# Patient Record
Sex: Male | Born: 1945 | Hispanic: Refuse to answer
Health system: Midwestern US, Community
[De-identification: ages and names within clinical notes are randomized; demographics above are authoritative.]

## PROBLEM LIST (undated history)

## (undated) DIAGNOSIS — I341 Nonrheumatic mitral (valve) prolapse: Secondary | ICD-10-CM

## (undated) DIAGNOSIS — K4090 Unilateral inguinal hernia, without obstruction or gangrene, not specified as recurrent: Secondary | ICD-10-CM

## (undated) DIAGNOSIS — E785 Hyperlipidemia, unspecified: Secondary | ICD-10-CM

## (undated) DIAGNOSIS — C61 Malignant neoplasm of prostate: Secondary | ICD-10-CM

## (undated) DIAGNOSIS — K219 Gastro-esophageal reflux disease without esophagitis: Secondary | ICD-10-CM

## (undated) DIAGNOSIS — C801 Malignant (primary) neoplasm, unspecified: Secondary | ICD-10-CM

## (undated) HISTORY — DX: Hyperlipidemia, unspecified: E78.5

## (undated) HISTORY — DX: Unilateral inguinal hernia, without obstruction or gangrene, not specified as recurrent: K40.90

## (undated) HISTORY — PX: PROSTATE BIOPSY: SHX241

## (undated) HISTORY — PX: INGUINAL HERNIA REPAIR: SUR1180

## (undated) HISTORY — DX: Gastro-esophageal reflux disease without esophagitis: K21.9

## (undated) HISTORY — PX: COLONOSCOPY: SHX174

## (undated) HISTORY — DX: Malignant (primary) neoplasm, unspecified: C80.1

---

## 1966-08-07 HISTORY — PX: TONSILLECTOMY: SUR1361

## 1997-08-07 HISTORY — PX: HAND SURGERY: SHX662

## 1997-11-05 ENCOUNTER — Encounter (HOSPITAL_COMMUNITY): Admission: RE | Admit: 1997-11-05 | Discharge: 1998-02-03 | Payer: Self-pay | Admitting: Psychiatry

## 1998-12-02 ENCOUNTER — Ambulatory Visit (HOSPITAL_BASED_OUTPATIENT_CLINIC_OR_DEPARTMENT_OTHER): Admission: RE | Admit: 1998-12-02 | Discharge: 1998-12-02 | Payer: Self-pay | Admitting: Orthopedic Surgery

## 2000-07-02 ENCOUNTER — Other Ambulatory Visit: Admission: RE | Admit: 2000-07-02 | Discharge: 2000-07-02 | Payer: Self-pay | Admitting: Gastroenterology

## 2000-07-02 ENCOUNTER — Encounter (INDEPENDENT_AMBULATORY_CARE_PROVIDER_SITE_OTHER): Payer: Self-pay

## 2004-09-05 ENCOUNTER — Ambulatory Visit: Payer: Self-pay | Admitting: Gastroenterology

## 2004-09-19 ENCOUNTER — Ambulatory Visit: Payer: Self-pay | Admitting: Gastroenterology

## 2007-03-05 ENCOUNTER — Ambulatory Visit: Payer: Self-pay

## 2007-03-05 ENCOUNTER — Encounter (INDEPENDENT_AMBULATORY_CARE_PROVIDER_SITE_OTHER): Payer: Self-pay | Admitting: Internal Medicine

## 2008-06-15 ENCOUNTER — Ambulatory Visit: Payer: Self-pay | Admitting: Gastroenterology

## 2008-06-22 ENCOUNTER — Encounter: Payer: Self-pay | Admitting: Gastroenterology

## 2008-06-22 ENCOUNTER — Ambulatory Visit: Payer: Self-pay | Admitting: Gastroenterology

## 2008-06-24 ENCOUNTER — Encounter: Payer: Self-pay | Admitting: Gastroenterology

## 2011-03-03 ENCOUNTER — Other Ambulatory Visit: Payer: Self-pay | Admitting: Dermatology

## 2011-09-20 ENCOUNTER — Encounter: Payer: Self-pay | Admitting: Gastroenterology

## 2012-05-01 ENCOUNTER — Encounter: Payer: Self-pay | Admitting: Gastroenterology

## 2012-06-24 ENCOUNTER — Encounter: Payer: Self-pay | Admitting: Gastroenterology

## 2012-07-17 ENCOUNTER — Encounter: Payer: Self-pay | Admitting: Gastroenterology

## 2012-07-17 ENCOUNTER — Ambulatory Visit (AMBULATORY_SURGERY_CENTER): Payer: BLUE CROSS/BLUE SHIELD | Admitting: *Deleted

## 2012-07-17 VITALS — Ht 67.0 in | Wt 135.2 lb

## 2012-07-17 DIAGNOSIS — Z1211 Encounter for screening for malignant neoplasm of colon: Secondary | ICD-10-CM

## 2012-07-17 DIAGNOSIS — Z86010 Personal history of colonic polyps: Secondary | ICD-10-CM

## 2012-07-17 MED ORDER — MOVIPREP 100 G PO SOLR: ORAL | Status: DC

## 2012-08-09 ENCOUNTER — Telehealth: Payer: Self-pay | Admitting: Gastroenterology

## 2012-08-09 ENCOUNTER — Encounter: Payer: Self-pay | Admitting: Gastroenterology

## 2012-08-09 ENCOUNTER — Ambulatory Visit (AMBULATORY_SURGERY_CENTER): Payer: Medicare Other | Admitting: Gastroenterology

## 2012-08-09 VITALS — BP 133/85 | HR 66 | Temp 97.5°F | Resp 14 | Ht 67.0 in | Wt 135.0 lb

## 2012-08-09 DIAGNOSIS — K635 Polyp of colon: Secondary | ICD-10-CM

## 2012-08-09 DIAGNOSIS — Z86010 Personal history of colonic polyps: Secondary | ICD-10-CM

## 2012-08-09 DIAGNOSIS — Z1211 Encounter for screening for malignant neoplasm of colon: Secondary | ICD-10-CM

## 2012-08-09 DIAGNOSIS — K5730 Diverticulosis of large intestine without perforation or abscess without bleeding: Secondary | ICD-10-CM

## 2012-08-09 DIAGNOSIS — D126 Benign neoplasm of colon, unspecified: Secondary | ICD-10-CM

## 2012-08-09 MED ORDER — SODIUM CHLORIDE 0.9 % IV SOLN: 500.0000 mL | INTRAVENOUS | Status: DC

## 2012-08-09 NOTE — Telephone Encounter (Signed)
Wife answered phone, states Tyler Waller is resting.  States he is having "crampy" abd  Pain.  Wanted to know if he could take tylenol.  Advised that tylenol is ok, but if pain becomes 4 on a scale of 0-10 To call office.  Also advised wife to have him up walking between rest periods, and to try lying of left side to alleviate discomfort.  Wife verbalized understanding.

## 2012-08-09 NOTE — Op Note (Signed)
[facility] [address] [place], [address]   COLONOSCOPY PROCEDURE REPORT  PATIENT: Tyler Waller, Tyler Waller  MR#: [id number] BIRTHDATE: 06-10-1946 , 66  yrs. old GENDER: Male ENDOSCOPIST: Mardella Layman, MD, [facility] REFERRED BY: PROCEDURE DATE:  08/09/2012 PROCEDURE:   Colonoscopy with snare polypectomy ASA CLASS:   Class II INDICATIONS: MEDICATIONS: Propofol (Diprivan) 240 mg IV  DESCRIPTION OF PROCEDURE:   After the risks and benefits and of the procedure were explained, informed consent was obtained.  A digital rectal exam revealed no abnormalities of the rectum.    The Fuse-Demo-Scope  endoscope was introduced through the anus and advanced to the cecum, which was identified by both the appendix and ileocecal valve .  The quality of the prep was adequate. .  The instrument was then slowly withdrawn as the colon was fully examined.     COLON FINDINGS: Images taken but only available in hard copy form that will be scanned into EPIC Agricultural engineer).   Mild diverticulosis was noted in the descending colon and sigmoid colon.   A smooth flat polyp ranging between 3-57mm in size was found at the hepatic flexure.  A polypectomy was performed with a cold snare.  The resection was complete and the polyp tissue was completely retrieved.   The colon was otherwise normal.  There was no diverticulosis, inflammation, polyps or cancers unless previously stated.     Retroflexed views revealed no abnormalities.     The scope was then withdrawn from the patient and the procedure completed.  COMPLICATIONS: There were no complications. ENDOSCOPIC IMPRESSION: 1.   Images taken but only available in hard copy form that will be scanned into EPIC [employer]). 2.   Mild diverticulosis was noted in the descending colon and sigmoid colon 3.   Flat polyp ranging between 3-52mm in size was found at the hepatic flexure; polypectomy was performed with a cold snare 4.   The colon was  otherwise normal  RECOMMENDATIONS: 1.  Repeat colonoscopy in 5 years if polyp adenomatous; otherwise 10 years 2.  High fiber diet   REPEAT EXAM:  cc:W.  Buren Kos, MD  _______________________________ eSigned:  Mardella Layman, MD, [facility] 08/09/2012 8:54 AM

## 2012-08-09 NOTE — Progress Notes (Signed)
Patient did not experience any of the following events: a burn prior to discharge; a fall within the facility; wrong site/side/patient/procedure/implant event; or a hospital transfer or hospital admission upon discharge from the facility. (G8907) Patient did not have preoperative order for IV antibiotic SSI prophylaxis. (G8918)  

## 2012-08-09 NOTE — Patient Instructions (Addendum)
YOU HAD AN ENDOSCOPIC PROCEDURE TODAY AT THE [facility]: Refer to the procedure report that was given to you for any specific questions about what was found during the examination.  If the procedure report does not answer your questions, please call your gastroenterologist to clarify.  If you requested that your care partner not be given the details of your procedure findings, then the procedure report has been included in a sealed envelope for you to review at your convenience later.  YOU SHOULD EXPECT: Some feelings of bloating in the abdomen. Passage of more gas than usual.  Walking can help get rid of the air that was put into your GI tract during the procedure and reduce the bloating. If you had a lower endoscopy (such as a colonoscopy or flexible sigmoidoscopy) you may notice spotting of blood in your stool or on the toilet paper. If you underwent a bowel prep for your procedure, then you may not have a normal bowel movement for a few days.  DIET: Your first meal following the procedure should be a light meal and then it is ok to progress to your normal diet.  A half-sandwich or bowl of soup is an example of a good first meal.  Heavy or fried foods are harder to digest and may make you feel nauseous or bloated.  Likewise meals heavy in dairy and vegetables can cause extra gas to form and this can also increase the bloating.  Drink plenty of fluids but you should avoid alcoholic beverages for 24 hours.  ACTIVITY: Your care partner should take you home directly after the procedure.  You should plan to take it easy, moving slowly for the rest of the day.  You can resume normal activity the day after the procedure however you should NOT DRIVE or use heavy machinery for 24 hours (because of the sedation medicines used during the test).    SYMPTOMS TO REPORT IMMEDIATELY: A gastroenterologist can be reached at any hour.  During normal business hours, 8:30 AM to 5:00 PM Monday through Friday,  call [phone].  After hours and on weekends, please call the GI answering service at [phone] who will take a message and have the physician on call contact you.   Following lower endoscopy (colonoscopy or flexible sigmoidoscopy):  Excessive amounts of blood in the stool  Significant tenderness or worsening of abdominal pains  Swelling of the abdomen that is new, acute  Fever of 100F or higher   FOLLOW UP: If any biopsies were taken you will be contacted by phone or by letter within the next 1-3 weeks.  Call your gastroenterologist if you have not heard about the biopsies in 3 weeks.  Our staff will call the home number listed on your records the next business day following your procedure to check on you and address any questions or concerns that you may have at that time regarding the information given to you following your procedure. This is a courtesy call and so if there is no answer at the home number and we have not heard from you through the emergency physician on call, we will assume that you have returned to your regular daily activities without incident.  SIGNATURES/CONFIDENTIALITY: You and/or your care partner have signed paperwork which will be entered into your electronic medical record.  These signatures attest to the fact that that the information above on your After Visit Summary has been reviewed and is understood.  Full responsibility of the confidentiality of   this discharge information lies with you and/or your care-partner.   Resume medications.Information given on polyps,diverticulosis with discharge instructions. 

## 2012-08-12 ENCOUNTER — Encounter: Payer: Self-pay | Admitting: Gastroenterology

## 2012-08-12 ENCOUNTER — Telehealth: Payer: Self-pay | Admitting: *Deleted

## 2012-08-12 NOTE — Telephone Encounter (Signed)
Left message that we called for f/u 

## 2012-08-13 ENCOUNTER — Encounter: Payer: Self-pay | Admitting: Gastroenterology

## 2014-04-16 ENCOUNTER — Encounter: Payer: Self-pay | Admitting: Gastroenterology

## 2016-10-31 DIAGNOSIS — L240 Irritant contact dermatitis due to detergents: Secondary | ICD-10-CM | POA: Diagnosis not present

## 2016-10-31 DIAGNOSIS — L570 Actinic keratosis: Secondary | ICD-10-CM | POA: Diagnosis not present

## 2016-10-31 DIAGNOSIS — C44619 Basal cell carcinoma of skin of left upper limb, including shoulder: Secondary | ICD-10-CM | POA: Diagnosis not present

## 2016-10-31 DIAGNOSIS — D2272 Melanocytic nevi of left lower limb, including hip: Secondary | ICD-10-CM | POA: Diagnosis not present

## 2016-10-31 DIAGNOSIS — D225 Melanocytic nevi of trunk: Secondary | ICD-10-CM | POA: Diagnosis not present

## 2016-10-31 DIAGNOSIS — D1801 Hemangioma of skin and subcutaneous tissue: Secondary | ICD-10-CM | POA: Diagnosis not present

## 2016-10-31 DIAGNOSIS — D485 Neoplasm of uncertain behavior of skin: Secondary | ICD-10-CM | POA: Diagnosis not present

## 2016-10-31 DIAGNOSIS — Z85828 Personal history of other malignant neoplasm of skin: Secondary | ICD-10-CM | POA: Diagnosis not present

## 2016-10-31 DIAGNOSIS — L821 Other seborrheic keratosis: Secondary | ICD-10-CM | POA: Diagnosis not present

## 2016-11-16 DIAGNOSIS — C44619 Basal cell carcinoma of skin of left upper limb, including shoulder: Secondary | ICD-10-CM | POA: Diagnosis not present

## 2016-11-16 DIAGNOSIS — Z85828 Personal history of other malignant neoplasm of skin: Secondary | ICD-10-CM | POA: Diagnosis not present

## 2017-06-05 DIAGNOSIS — H43811 Vitreous degeneration, right eye: Secondary | ICD-10-CM | POA: Diagnosis not present

## 2017-06-05 DIAGNOSIS — H04123 Dry eye syndrome of bilateral lacrimal glands: Secondary | ICD-10-CM | POA: Diagnosis not present

## 2017-06-05 DIAGNOSIS — H2513 Age-related nuclear cataract, bilateral: Secondary | ICD-10-CM | POA: Diagnosis not present

## 2017-07-10 DIAGNOSIS — E7849 Other hyperlipidemia: Secondary | ICD-10-CM | POA: Diagnosis not present

## 2017-07-10 DIAGNOSIS — R7301 Impaired fasting glucose: Secondary | ICD-10-CM | POA: Diagnosis not present

## 2017-07-10 DIAGNOSIS — Z125 Encounter for screening for malignant neoplasm of prostate: Secondary | ICD-10-CM | POA: Diagnosis not present

## 2017-07-17 DIAGNOSIS — R7301 Impaired fasting glucose: Secondary | ICD-10-CM | POA: Diagnosis not present

## 2017-07-17 DIAGNOSIS — Z86010 Personal history of colonic polyps: Secondary | ICD-10-CM | POA: Diagnosis not present

## 2017-07-17 DIAGNOSIS — Z0000 Encounter for general adult medical examination without abnormal findings: Secondary | ICD-10-CM | POA: Diagnosis not present

## 2017-07-17 DIAGNOSIS — R972 Elevated prostate specific antigen [PSA]: Secondary | ICD-10-CM | POA: Diagnosis not present

## 2017-07-17 DIAGNOSIS — E7849 Other hyperlipidemia: Secondary | ICD-10-CM | POA: Diagnosis not present

## 2017-07-17 DIAGNOSIS — R748 Abnormal levels of other serum enzymes: Secondary | ICD-10-CM | POA: Diagnosis not present

## 2017-07-17 DIAGNOSIS — Z1389 Encounter for screening for other disorder: Secondary | ICD-10-CM | POA: Diagnosis not present

## 2017-07-17 DIAGNOSIS — I10 Essential (primary) hypertension: Secondary | ICD-10-CM | POA: Diagnosis not present

## 2017-07-17 DIAGNOSIS — Z6820 Body mass index (BMI) 20.0-20.9, adult: Secondary | ICD-10-CM | POA: Diagnosis not present

## 2017-07-17 DIAGNOSIS — Z23 Encounter for immunization: Secondary | ICD-10-CM | POA: Diagnosis not present

## 2017-07-17 DIAGNOSIS — L84 Corns and callosities: Secondary | ICD-10-CM | POA: Diagnosis not present

## 2017-07-17 DIAGNOSIS — K4090 Unilateral inguinal hernia, without obstruction or gangrene, not specified as recurrent: Secondary | ICD-10-CM | POA: Diagnosis not present

## 2017-08-29 ENCOUNTER — Encounter: Payer: Self-pay | Admitting: *Deleted

## 2017-09-01 DIAGNOSIS — E785 Hyperlipidemia, unspecified: Secondary | ICD-10-CM | POA: Diagnosis not present

## 2017-09-01 DIAGNOSIS — R531 Weakness: Secondary | ICD-10-CM | POA: Diagnosis not present

## 2017-09-01 DIAGNOSIS — I6350 Cerebral infarction due to unspecified occlusion or stenosis of unspecified cerebral artery: Secondary | ICD-10-CM | POA: Diagnosis not present

## 2017-09-01 DIAGNOSIS — J8410 Pulmonary fibrosis, unspecified: Secondary | ICD-10-CM | POA: Diagnosis not present

## 2017-09-01 DIAGNOSIS — R55 Syncope and collapse: Secondary | ICD-10-CM | POA: Diagnosis not present

## 2017-09-01 DIAGNOSIS — Z87891 Personal history of nicotine dependence: Secondary | ICD-10-CM | POA: Diagnosis not present

## 2017-09-01 DIAGNOSIS — R4182 Altered mental status, unspecified: Secondary | ICD-10-CM | POA: Diagnosis not present

## 2017-09-01 DIAGNOSIS — G9389 Other specified disorders of brain: Secondary | ICD-10-CM | POA: Diagnosis not present

## 2017-09-01 DIAGNOSIS — R4702 Dysphasia: Secondary | ICD-10-CM | POA: Diagnosis not present

## 2017-09-01 DIAGNOSIS — R471 Dysarthria and anarthria: Secondary | ICD-10-CM | POA: Diagnosis not present

## 2017-09-01 DIAGNOSIS — R270 Ataxia, unspecified: Secondary | ICD-10-CM | POA: Diagnosis not present

## 2017-09-01 DIAGNOSIS — Z85828 Personal history of other malignant neoplasm of skin: Secondary | ICD-10-CM | POA: Diagnosis not present

## 2017-09-01 DIAGNOSIS — E7800 Pure hypercholesterolemia, unspecified: Secondary | ICD-10-CM | POA: Diagnosis not present

## 2017-09-01 DIAGNOSIS — R29818 Other symptoms and signs involving the nervous system: Secondary | ICD-10-CM | POA: Diagnosis not present

## 2017-09-01 DIAGNOSIS — R4781 Slurred speech: Secondary | ICD-10-CM | POA: Diagnosis not present

## 2017-09-01 DIAGNOSIS — I639 Cerebral infarction, unspecified: Secondary | ICD-10-CM | POA: Diagnosis not present

## 2017-09-01 DIAGNOSIS — I340 Nonrheumatic mitral (valve) insufficiency: Secondary | ICD-10-CM | POA: Diagnosis not present

## 2017-09-01 DIAGNOSIS — I959 Hypotension, unspecified: Secondary | ICD-10-CM | POA: Diagnosis not present

## 2017-09-01 NOTE — Nursing Note (Signed)
Adult Patient History Form-Text       Adult Patient History Entered On:  09/01/2017 22:20 EST    Performed On:  09/01/2017 22:11 EST by Clydie Braun, RN, MARY A               General Info   Patient Identified :   Identification band, Verbal   Patient Identified :   Steven May   Information Given By :   Self, Spouse   Preferred Mode of Communication :   Verbal, Written   Accompanied By :   Spouse, Daughter   In Charge of News (ICON) Name :   [name] (wife) [phone]   Pregnancy Status :   N/A   In Charge of News (ICON) Code :   [phone]   Has the patient received chemotherapy or biotherapy within the last 48 hours? :   No   Is the patient currently (2-3 days) receiving radiation treatment? :   No   ECKARD, RN, MARY A - 09/01/2017 22:11 EST   Allergies   (As Of: 09/01/2017 22:20:00 EST)   Allergies (Active)   No Known Medication Allergies  Estimated Onset Date:   Unspecified ; Created By:   Julienne Kass RN, Pilar Grammes; Reaction Status:   Active ; Category:   Drug ; Substance:   No Known Medication Allergies ; Type:   Allergy ; Updated By:   Gordy Clement; Reviewed Date:   09/01/2017 22:13 EST        Medication History   Medication List   (As Of: 09/01/2017 22:20:00 EST)   Normal Order    aspirin 325 mg DR Tab  :   aspirin 325 mg DR Tab ; Status:   Ordered ; Ordered As Mnemonic:   aspirin ; Simple Display Line:   325 mg, 1 tabs, Oral, Daily ; Ordering Provider:   Ala Bent; Catalog Code:   aspirin ; Order Dt/Tm:   09/01/2017 20:55:01 ; Comment:   DO NOT CRUSH          enoxaparin 40 mg/0.4 mL Inj Soln 0.4 mL  :   enoxaparin 40 mg/0.4 mL Inj Soln 0.4 mL ; Status:   Ordered ; Ordered As Mnemonic:   Lovenox ; Simple Display Line:   40 mg, 0.4 mL, Subcutaneous, Daily ; Ordering Provider:   Ala Bent; Catalog Code:   enoxaparin ; Order Dt/Tm:   09/01/2017 20:54:47 ; Comment:   HIGH ALERT MEDICATION - USE CAUTION........Marland KitchenCBC every other day.Marland KitchenMarland KitchenMarland KitchenNotify MD if platelet <150,000 or drops > 50% from  Baseline          labetalol 5 mg/mL IV Soln 4 mL  :   labetalol 5 mg/mL IV Soln 4 mL ; Status:   Ordered ; Ordered As Mnemonic:   labetalol IV push ; Simple Display Line:   10 mg, 2 mL, IV Push, q95min, PRN: other (see comment) ; Ordering Provider:   Ala Bent; Catalog Code:   labetalol ; Order Dt/Tm:   09/01/2017 20:55:01 ; Comment:   For Systolic greater than 220 or DBP greater than 120 (SBP and DBP maximums are acute Ischemic Stroke recommendations) In Ischemic Stroke, do NOT lower SBP less than 200 or DBP less than 100 with medications unless specified by MD. Use tPA Order Set for BP management if tPA administered          aspirin 81 mg Chew Tab  :   aspirin 81 mg Chew Tab ; Status:  Completed ; Ordered As Mnemonic:   aspirin ; Simple Display Line:   324 mg, 4 tabs, Chewed, Once ; Ordering Provider:   Debera Lat; Catalog Code:   aspirin ; Order Dt/Tm:   09/01/2017 19:41:57          iopamidol 76% Inj Soln 100 mL  :   iopamidol 76% Inj Soln 100 mL ; Status:   Completed ; Ordered As Mnemonic:   Isovue-370 ; Simple Display Line:   100 mL, IV Contrast, On Call ; Ordering Provider:   Debera Lat; Catalog Code:   iopamidol ; Order Dt/Tm:   09/01/2017 19:15:43            Home Meds    rosuvastatin  :   rosuvastatin ; Status:   Documented ; Ordered As Mnemonic:   rosuvastatin 5 mg oral tablet ; Simple Display Line:   5 mg, 1 tabs, Oral, Daily, 0 Refill(s) ; Catalog Code:   rosuvastatin ; Order Dt/Tm:   09/01/2017 18:47:51            Problem History   (As Of: 09/01/2017 22:20:00 EST)   Problems(Active)    Hypercholesterolemia (SNOMED CT  :16109604 )  Name of Problem:   Hypercholesterolemia ; Recorder:   Clydie Braun, RN, MARY A; Confirmation:   Confirmed ; Classification:   Patient Stated ; Code:   [id number] ; Contributor System:   Dietitian ; Last Updated:   09/01/2017 22:13 EST ; Life Cycle Date:   09/01/2017 ; Life Cycle Status:   Active ; Vocabulary:   SNOMED CT        Skin cancer (SNOMED  CT  :1478295621 )  Name of Problem:   Skin cancer ; Onset Date:   2017 ; Recorder:   ECKARD, RN, MARY A; Confirmation:   Confirmed ; Classification:   Patient Stated ; Code:   [id number] ; Contributor System:   PowerChart ; Last Updated:   09/01/2017 22:14 EST ; Life Cycle Date:   09/01/2017 ; Life Cycle Status:   Active ; Vocabulary:   SNOMED CT   ; Comments:        09/01/2017 22:14 - Clydie Braun, RN, MARY A  removed        Diagnoses(Active)    Altered mental state  Date:   09/01/2017 ; Diagnosis Type:   Discharge ; Confirmation:   Confirmed ; Clinical Dx:   Altered mental state ; Classification:   Medical ; Clinical Service:   Non-Specified ; Code:   ICD-10-CM ; Probability:   0 ; Diagnosis Code:   R41.82      Potential stroke  Date:   09/01/2017 ; Diagnosis Type:   Reason For Visit ; Confirmation:   Confirmed ; Clinical Dx:   Potential stroke ; Classification:   Medical ; Clinical Service:   Emergency medicine ; Code:   PNED ; Probability:   0 ; Diagnosis Code:   [id number]        Procedure History        -    Procedure History   (As Of: 09/01/2017 22:20:00 EST)     Immunizations   Influenza Vaccine Status :   Received prior to admission, during current flu season   [place], RN, [place] A - 09/01/2017 22:11 EST   ID Risk Screen   Chills :   No   Cough (Any Duration) :   No   Fever :   No   Hemoptysis (Blood in Sputum) :  No   Night Sweats :   No   ECKARD, RN, MARY A - 09/01/2017 22:11 EST   3 or more loose/watery stools :   No   MRSA/VRE Screening :   None of these apply   Patient Recent Travel History :   No recent travel   Family Member Travel History :   No recent travel   [place], RN, [place] A - 09/01/2017 22:11 EST   Bloodless Medicine   Will Patient Accept Blood Transfusion and/or Blood Products :   Yes   ECKARD, RN, MARY A - 09/01/2017 22:11 EST   Nutrition   Nutritional Risk Factors :   None   ECKARD, RN, MARY A - 09/01/2017 22:11 EST   Functional   Sensory Deficits :   None   ADLs Prior to Admission  :   Independent   ECKARD, RN, MARY A - 09/01/2017 22:11 EST   Social History   Social History   (As Of: 09/01/2017 22:20:00 EST)   Tobacco:        Tobacco use: Former smoker, quit more than 30 days ago.   (Last Updated: 09/01/2017 22:17:10 EST by Clydie BraunECKARD, RN, MARY A)          Alcohol:        Denies   (Last Updated: 09/01/2017 22:17:15 EST by Clydie BraunECKARD, RN, MARY A)          Substance Abuse:        Denies   (Last Updated: 09/01/2017 22:17:21 EST by Clydie BraunECKARD, RN, MARY A)            Spiritual   Faith/Denomination :   Ephriam KnucklesChristian   Do you have a concern that you would like to address with a Chaplain? :   No   ECKARD, RN, MARY A - 09/01/2017 22:11 EST   Harm Screen   Injuries/Abuse/Neglect in Household :   Denies   Feels Unsafe at Home :   No   Suicidal Behavior :   None   Self Harming Behavior :   None   Suicidal Ideation :   None   ECKARD, RN, MARY A - 09/01/2017 22:11 EST   Advance Directive   Advance Directive :   Yes   Type of Advance Directive :   Living will, Medical durable power of attorney   Clydie BraunCKARD, RN, Corrie DandyMARY A - 09/01/2017 22:11 EST   Education   Written Language :   Lenox PondsEnglish   Primary Language :   Jacinto ReapEnglish   ECKARD, RN, MARY A - 09/01/2017 22:11 EST   Caregiver/Advocate Language   Patient :   Demonstration, Verbal explanation   Family :   Demonstration, Verbal explanation   ECKARD, RN, MARY A - 09/01/2017 22:11 EST   Barriers to Learning :   None evident   Teaching Method :   Demonstration, Explanation, Printed materials   Responsible Learner Present for Session :   Yes   Additional Session Learner(s) Present :   Spouse   ECKARD, RN, MARY A - 09/01/2017 22:11 EST   DCP GENERIC CODE   Unit/Room Orientation :   Verbalizes understanding   Environmental Safety :   Verbalizes understanding   Hand Washing :   Verbalizes understanding   Infection Prevention :   Verbalizes understanding   DVT Prophylaxis :   Verbalizes understanding   Isolation Precaution :   Verbalizes understanding   ECKARD, RN, MARY A - 09/01/2017 22:11 EST   DC Needs    Living  Situation :   Home independently   Anticipated Discharge Needs :   None   ECKARD, RN, MARY A - 09/01/2017 22:11 EST   Valuables and Belongings   Does Patient Have Valuables and Belongings :   Yes   ECKARD, RN, MARY A - 09/01/2017 22:11 EST   DCP GENERIC CODE   At Bedside :   Clothes, Jewelry, Purse/Wallet, wedding ring   ECKARD, RN, MARY A - 09/01/2017 22:11 EST   Admission Complete   Admission Complete :   Yes   ECKARD, RN, MARY A - 09/01/2017 22:11 EST

## 2017-09-02 DIAGNOSIS — R4781 Slurred speech: Secondary | ICD-10-CM | POA: Diagnosis not present

## 2017-09-02 DIAGNOSIS — R410 Disorientation, unspecified: Secondary | ICD-10-CM | POA: Diagnosis not present

## 2017-09-02 DIAGNOSIS — E7800 Pure hypercholesterolemia, unspecified: Secondary | ICD-10-CM | POA: Diagnosis not present

## 2017-09-02 DIAGNOSIS — Z7982 Long term (current) use of aspirin: Secondary | ICD-10-CM | POA: Diagnosis not present

## 2017-09-02 DIAGNOSIS — I959 Hypotension, unspecified: Secondary | ICD-10-CM | POA: Diagnosis not present

## 2017-09-02 DIAGNOSIS — Z87891 Personal history of nicotine dependence: Secondary | ICD-10-CM | POA: Diagnosis not present

## 2017-09-02 DIAGNOSIS — R55 Syncope and collapse: Secondary | ICD-10-CM | POA: Diagnosis not present

## 2017-09-02 DIAGNOSIS — I6350 Cerebral infarction due to unspecified occlusion or stenosis of unspecified cerebral artery: Secondary | ICD-10-CM | POA: Diagnosis not present

## 2017-09-02 DIAGNOSIS — E785 Hyperlipidemia, unspecified: Secondary | ICD-10-CM | POA: Diagnosis not present

## 2017-09-02 NOTE — Discharge Summary (Signed)
Inpatient Clinical Summary             [facility]  Post-Acute Care Transfer Instructions  PERSON INFORMATION   Name: Steven May, Steven   MRN: [id number]    FIN#: [id number]   PHYSICIANS  Admitting Physician: Ala BentBOND-MD,  BROOKE ERIKA  Attending Physician: Ala BentBOND-MD,  BROOKE ERIKA   PCP: PCP,  NONE  Discharge Diagnosis: Altered mental state; Hyperlipidemia; Near syncope  Comment:       PATIENT EDUCATION INFORMATION  Instructions:               Medication Leaflets:               Follow-up:                           With: Address: When:   Call [facility] office at [phone] if you have any questions regarding your hospitalization.         With: Address: When:   LARS RUNQUIST-MD [address] [place], [place] [id number]  [phone] Business (1) In 1 day 09/03/2017   Comments:   Call [facility] office on 09/03/17 to obtain a 30-day event monitor prior [place]       With: Address: When:   Primary Care Physician Dr. Durwin NoraWilliam Doughlas Shaw of [facility] [phone]([address] [place], [place])  Within 1 to 2 weeks   Comments:   Call for followup appointment from hospitali discharge.                             MEDICATION LIST  Medication Reconciliation at Discharge:          Medications That Have Not Changed  Other Medications  multivitamin with minerals (Centrum Silver oral tablet) 1 Tabs Oral (given by mouth) Monday/Wednesday/Friday.  Last Dose:____________________  naphazoline-pheniramine ophthalmic (Opcon-A 0.027%-0.315% ophthalmic solution) 1 Drops Ophthalmic (the eye) every day as needed allergy symptoms.  Last Dose:____________________  naproxen (Aleve 220 mg oral tablet) 1 Tabs Oral (given by mouth) every 12 hours as needed as needed for pain.  Last Dose:____________________  rosuvastatin (rosuvastatin 5 mg oral tablet) 1 Tabs Oral (given by mouth) every day.  Last Dose:____________________         Patient???s Final Home Medication List Upon Discharge:           multivitamin with  minerals (Centrum Silver oral tablet) 1 Tabs Oral (given by mouth) Monday/Wednesday/Friday.  naphazoline-pheniramine ophthalmic (Opcon-A 0.027%-0.315% ophthalmic solution) 1 Drops Ophthalmic (the eye) every day as needed allergy symptoms.  naproxen (Aleve 220 mg oral tablet) 1 Tabs Oral (given by mouth) every 12 hours as needed as needed for pain.  rosuvastatin (rosuvastatin 5 mg oral tablet) 1 Tabs Oral (given by mouth) every day.         Comment:       ORDERS          Order Name Order Details   Discharge Patient 09/02/17 16:52:00 EST, Discharge Home/Self Care

## 2017-09-02 NOTE — Case Communication (Signed)
CM Discharge Planning Assessment - Text       CM Discharge Planning Assessment Entered On:  09/02/2017 16:58 EST    Performed On:  09/02/2017 16:56 EST by Otelia Santee R               Home Environment   Living Environment :   Living Situation: Home independently  Current Home Treatments:   Home Devices/Equipment   Professional Skilled Services:   Therapist, sports and Community Resources:   Sensory Deficits:   Performed by: Margo Aye, PT, COURTNEY B-09/02/17 11:05:00       Affect/Behavior :   Appropriate, Calm, Cooperative   Lives With :   Spouse   Lives In :   Multilevel home   Living Situation :   Home independently   Key Contact Information :   PCP - Dr. Sandra Cockayne in [place], [place]   Outside Facility Information :   Pharmcy - [facility] in [place], [place]   Barriers at Home :   None   Nelda Marseille - 09/02/2017 16:56 EST   Home Environment   Home Equipment Rehab :   None   ADLs Prior to Admission :   Independent   Sensory Deficits :   None   SPRINGER,  COURTNEY R - 09/02/2017 16:56 EST   Discharge Needs I   CM Progress Note :   09/02/17  CRS:  CM met with pt to complete initial assessment.  His wife, Steven May, was present at bedside.  Pt is being discharged today.  They are in town from [facility] visiting their daughter.  Pt did not identify any needs or concerns at this time.     Otelia Santee R - 09/02/2017 17:01 EST   Previously Documented Discharge Needs :   DISCHARGE PLAN/NEEDS:  EQUIPMENT/TREATMENT NEEDS:       Previously Documented Benefits Information :   No discharge data available.     Anticipated Discharge Date :   09/02/2017 EST   Anticipated Discharge Time Slot :   1600-1800   Discharge To :   Home independently, Home with family support   Home Caregiver Name/Relationship CM :   Dondi Aime (wife): [phone]   Nelda Marseille - 09/02/2017 16:56 EST   Discharge Needs II   DischargDischarge Device/Equipment CMe Device/Equipment CM :   None   Professional Skilled Services :    No Needs   Needs Assistance with Transportation :   No   Discharge Transportation Arranged :   N/A   Needs Assistance at Home Upon Discharge :   No   Requires Caregiver Involvement :   No   Discharge Planning Time Spent :   20 minutes   Otelia Santee R - 09/02/2017 16:56 EST   Benefits   Admission Medicare Message Provided :   09/01/2017 20:03 EST   Nelda Marseille - 09/02/2017 16:56 EST   Insurance Information :   Managed Medicare   SPRINGER,  COURTNEY R - 09/02/2017 17:01 EST   Advance Directive   *Advance Directive :   No   (Comment: None on file Otelia Santee R - 09/02/2017 17:01 EST] )   Otelia Santee R - 09/02/2017 17:01 EST   Discharge Planning   Discharge Arrangements :       Patient Post-Acute Information    Patient Name: Steven May, Steven May  MRN: [id number]  FIN: [id number]  Gender: Male  DOB: August 29, 2045  Age:  72  Years        [facility] Post-Acute Placement(s):    Business Name: Business Address: Phone Number:      Discharged to home              *** No Post-Acute Service(s) Listed ***       Interventions Performed :   All resolved   Discharge Plan Discussion :   Discussed with patient, Discussed with family/caregiver, Patient agrees with plan, Family agrees with plan   Barriers to Discharge Identified :   None identified   Nelda MarseilleSPRINGER,  COURTNEY R - 09/02/2017 16:56 EST   Discharge Planning Assessment Status   Discharge Planning Assessment Complete :   Yes   SPRINGER,  COURTNEY R - 09/02/2017 16:56 EST

## 2017-09-02 NOTE — Nursing Note (Signed)
Nursing Discharge Summary - Text       Physician Discharge Summary Entered On:  09/02/2017 16:50 EST    Performed On:  09/02/2017 16:50 EST by Janeice RobinsonONWAY-DO,  SARAH T               DC Information   Provider Instructions for Diet :   A Healthy Diet   Provider Instructions for Activity :   Continue your regular activity   Janeice RobinsonCONWAY-DO,  SARAH T - 09/02/2017 16:50 EST

## 2017-09-02 NOTE — Nursing Note (Signed)
Medication Administration Follow Up-Text       Medication Administration Follow Up Entered On:  09/02/2017 9:48 EST    Performed On:  09/02/2017 9:48 EST by Cephus ShellingKane-Eames, RN, Elizabeth      Intervention Information:     acetaminophen  Performed by Cephus ShellingKane-Eames, RN, Elizabeth on 09/02/2017 08:21:00 EST       acetaminophen,650mg   Oral,mild pain (1-3)       Med Response   ED Medication Response :   Symptoms improved, Continue to observe for symptoms   Numeric Rating Pain Scale :   1   Pasero Opioid Induced Sedation Scale :   1 = Awake and alert   Respiratory Rate :   16 br/min   Cephus ShellingKane-Eames, RLanora Manis, Elizabeth - 09/02/2017 9:48 EST

## 2017-09-02 NOTE — Progress Notes (Signed)
SLP Time Spent With Patient - Text       SLP Time Spent With Patient Entered On:  09/02/2017 10:20 EST    Performed On:  09/02/2017 10:16 EST by Lissa MerlinFontenot,  Skyler J               Time Spent With Patient   SLP Time In :   10:10 EST   SLP Time Out :   10:12 EST   SLP Speech Althia FortsLang Eval Time :   0 minutes   SLP Total Individual Therapy Time :   0 minutes   SLP Treatment Time Comment :   Spoke w/ nurse, Marisue IvanLiz, who repots pt's speech is slow and possible cog deficits. Nursing reports pt tolerating regular diet without difficulty. Tolerating meds whole without difficulty. ST to follow-up Monday for speech/language eval.    SLP Total Tx Time :   0 minutes   Sherrie MustacheFontenot,  Skyler J - 09/02/2017 10:16 EST   OT Units Cancelled Missed     SLP Units Lost #1          Amount :    0                 Sherrie MustacheFontenot,  Skyler J - 09/02/2017 10:16 EST

## 2017-09-02 NOTE — Progress Notes (Signed)
Inpatient PT Examination - Text       Inpatient PT Examination Entered On:  09/02/2017 12:48 EST    Performed On:  09/02/2017 11:05 EST by HALL, PT, COURTNEY B               Reason for Treatment   Subjective Statement :   Pt transported to ER via ambulance. He picked up grandkids with wife and she noticed his speech was not right. He was able to get up steps after tripping and fell on floor when he reached the top. Pt doesn't recall the events.      *Reason for Referral :   adm for TIA vs CVA (mainly slurred speech)  negative MRI     HALL, PT, COURTNEY B - 09/02/2017 12:38 EST   General Info   Physical Therapy Orders :   PT Evaluation and Treatment Acute - 09/01/17 20:54:00 EST, Stop date 09/01/17 20:54:00 EST     Precautions RTF :    Hospitalist Team Color, 09/02/17 7:20:00 EST, Blue, Constant Indicator, Ordered   Notify Provider, 09/01/17 20:54:00 EST, Notify physician for any increased deficits., 09/01/17 20:54:00 EST, 09/01/17 20:54:00 EST, Ordered   Notify Provider, 09/01/17 20:54:00 EST, Failed nursing swallow  screen, 09/01/17 20:54:00 EST, 09/01/17 20:54:00 EST, Ordered   Notify Provider Laboratory Results, 09/01/17 20:54:00 EST, Blood glucose less than 70 or greater than 200 mg/dL (x 2 consecutive measurements), 09/01/17 20:54:00 EST, Ordered   Notify Provider Vital Signs, 09/01/17 20:54:00 EST, SBP greater than 220 or less than 110, 09/01/17 20:54:00 EST, Ordered   Notify Provider Vital Signs, 09/01/17 20:54:00 EST, DBP greater than 120, 09/01/17 20:54:00 EST, Ordered   Notify Rapid Response Team, 09/01/17 20:54:00 EST, For concerns regarding patient condition & notify MD, 09/01/17 20:54:00 EST, 09/01/17 20:54:00 EST, Ordered   Change attending to, 09/01/17 19:42:00 EST, BOND-MD,  BROOKE ERIKA, Ordered   Fall Risk Precautions, 09/01/17 18:54:57 EST, Stop date 09/01/17 18:54:57 EST, Ordered     Orientation Assessment :   Oriented x 4   Affect/Behavior :   Appropriate, Cooperative   Basic Command Following :    Intact   Safety/Judgment :   Intact   Pain Present :   No actual or suspected pain   HALL, PT, COURTNEY B - 09/02/2017 12:38 EST   Problem List   (As Of: 09/02/2017 12:48:01 EST)   Problems(Active)    At risk for injury (SNOMED CT  :956213086 )  Name of Problem:   At risk for injury ; Recorder:   SYSTEM,  SYSTEM; Confirmation:   Confirmed ; Classification:   Interdisciplinary ; Code:   578469629 ; Last Updated:   09/01/2017 23:03 EST ; Life Cycle Date:   09/01/2017 ; Life Cycle Status:   Active ; Vocabulary:   SNOMED CT   ; Comments:        09/01/2017 23:03 - SYSTEM,  SYSTEM  Problem added automatically by system based on initiation of Risk for Injury Plan of Care      Hypercholesterolemia (SNOMED CT  :52841324 )  Name of Problem:   Hypercholesterolemia ; Recorder:   Clydie Braun, RN, MARY A; Confirmation:   Confirmed ; Classification:   Patient Stated ; Code:   40102725 ; Contributor System:   Dietitian ; Last Updated:   09/01/2017 22:13 EST ; Life Cycle Date:   09/01/2017 ; Life Cycle Status:   Active ; Vocabulary:   SNOMED CT        Skin cancer (SNOMED CT  :  6606301601 )  Name of Problem:   Skin cancer ; Onset Date:   2017 ; Recorder:   ECKARD, RN, MARY A; Confirmation:   Confirmed ; Classification:   Patient Stated ; Code:   [id number] ; Contributor System:   PowerChart ; Last Updated:   09/01/2017 22:14 EST ; Life Cycle Date:   09/01/2017 ; Life Cycle Status:   Active ; Vocabulary:   SNOMED CT   ; Comments:        09/01/2017 22:14 - Clydie Braun, RN, MARY A  removed        Diagnoses(Active)    Altered mental state  Date:   09/01/2017 ; Diagnosis Type:   Discharge ; Confirmation:   Confirmed ; Clinical Dx:   Altered mental state ; Classification:   Medical ; Clinical Service:   Non-Specified ; Code:   ICD-10-CM ; Probability:   0 ; Diagnosis Code:   R41.82      Potential stroke  Date:   09/01/2017 ; Diagnosis Type:   Reason For Visit ; Confirmation:   Confirmed ; Clinical Dx:   Potential stroke ; Classification:   Medical ; Clinical  Service:   Emergency medicine ; Code:   PNED ; Probability:   0 ; Diagnosis Code:   [id number]        Home Environment   Living Environment :   Home Environment  Living Situation:  Home independently  Performed By:  Clydie Braun RN, Corrie Dandy A 09/01/2017  Sensory Deficits:  None  Performed By:  Clydie Braun RN, MARY A 09/01/2017     Living Situation :   Home independently   Lives With :   Spouse   Lives In :   Multilevel home   Detail Areas of Responsibilities :   resides in [place] with wife; visiting daughter and her family in [place], [place], [place] B - 09/02/2017 12:38 EST   Home Environment II   Living Environment :   Home Environment  Living Situation:  Home independently  Performed By:  Quentin Cornwall A 09/01/2017  Sensory Deficits:  None  Performed By:  Clydie Braun RN, Lamar Blinks 09/01/2017     HALL, PT, COURTNEY B - 09/02/2017 12:38 EST   Prior Functional Status   ADL :   Independent   Mobility :   Independent   Instrumental ADL :   Independent   Cognitive-Communication Skills :   Independent   HALL, PT, COURTNEY B - 09/02/2017 12:38 EST   Additional Information :   walks neighborhood for exercise   HALL, PT, COURTNEY B - 09/02/2017 12:38 EST   LE Range/Strength   LE Overall Range of Motion Grid   Left Lower Extremity Active Range :   Within functional limits   Right Lower Extremity Active Range :   Within functional limits   HALL, PT, COURTNEY B - 09/02/2017 12:38 EST   Lt Lower Extremity Strength :   Within functional limits   Rt Lower Extremity Strength :   Within functional limits   HALL, PT, COURTNEY B - 09/02/2017 12:38 EST   UE Strength/ROM   Upper Extremity Overall ROM Grid   Left Upper Extremity Active Range :   Within functional limits   Right Upper Extremity Active Range :   Within functional limits   HALL, PT, COURTNEY B - 09/02/2017 12:38 EST   Lt Upper Extremity Strength :   Within functional limits   Rt Upper Extremity Strength :   Within functional limits  HALL, PT, COURTNEY B - 09/02/2017  12:38 EST   UE Tone         remainder (less than 1/2 of ROM)   Left Upper Extremity :   Normal   Right Upper Extremity :   Normal   HALL, PT, COURTNEY B - 09/02/2017 12:38 EST   Sensation   Impact of Impaired LE Sensation :   deined T/N   HALL, PT, COURTNEY B - 09/02/2017 12:38 EST   Mobility   Mobility Grid   Supine to Sit :   Rehab Complete independence   Transfer Sit to Stand :   Close supervision   Transfer Stand to Sit :   Close supervision   HALL, PT, COURTNEY B - 09/02/2017 12:38 EST   Ambulation Level :   Complete independence   Ambulation Quality :   no LOB; able to scan environment without LOB or veering   Ambulation Distance :   600 ft   Device :   None   HALL, PT, COURTNEY B - 09/02/2017 12:38 EST   Coordination   Left Upper Extremity Coordination Grid   Finger Opposition :   Within functional limits   HALL, PT, COURTNEY B - 09/02/2017 12:38 EST   Right Upper Extremty Coordination Grid   Finger Opposition :   Within functional limits   HALL, PT, COURTNEY B - 09/02/2017 12:38 EST   Left Lower Extremity Coordination Grid   Toe Taps :   Within functional limits   HALL, PT, COURTNEY B - 09/02/2017 12:38 EST   Right Lower Extremity Coordination   Toe Taps :   Within functional limits   HALL, PT, COURTNEY B - 09/02/2017 12:38 EST   Assessment   Discharge Recommendations :   possible DC home today pending other test; safe from PT perspective to return home     PT Treatment Recommendations :   Pt is 71YOWM adm due to TIA vs CVA, MRI negative and appears symptoms have resolved. According to pt and wife, speech appears normal and did not notice any deficits during conversation. He was ambulatory without AD. Ambulated at his normal cadence and able to scan environment without difficulty. Negotiated steps in gym with no deficits. Strength and ROM=WNL and 5/5. Maintained balance during rhomberg testing with eyes open and closed (narrow base of support), no issue with SLS. Skilled PT no longer indicated-pt appears to be at  baseline.      HALL, PT, COURTNEY B - 09/02/2017 12:38 EST   Long Term Goals   PT LT Goals Reviewed :   Yes   HALL, PT, COURTNEY B - 09/02/2017 12:38 EST   Short Term Goals   PT ST Goals Reviewed :   Yes   HALL, PT, COURTNEY B - 09/02/2017 12:38 EST   Plan   Treatment Plan/Goals Established With Patient/Caregiver :   Yes   Evaluation Complete :   Yes   HALL, PT, COURTNEY B - 09/02/2017 12:38 EST   Time Spent With Patient   PT Evaluation Units, Low Complexity :   1 Unit   PT Time In :   11:05 EST   PT Time Out :   11:22 EST   PT Individual Eval Time, Low Complexity :   17 minutes   PT Total Individual Min :   17    PT Total Untimed Min Ac/Outp :   17    PT Total Treatment Time Ac/Outp :   17    HALL,  PT, COURTNEY B - 09/02/2017 12:38 EST

## 2017-09-02 NOTE — Discharge Summary (Signed)
Inpatient Patient Summary               [facility]  [address]  [place], [place] [address]  [phone]  Patient Discharge Instructions    Name: Steven May, Steven May  Current Date: 09/02/2017 17:02:51  DOB: 09/25/1945 MRN: [id number] FIN: MWU%>[id number]%>[phone]  Patient Address[place] [address] [place] [address]  Patient Phone: [phone]  Primary Care Provider:  Name: PCP,  NONE  Phone:    Immunizations Provided:      Discharge Diagnosis: Altered mental state; Hyperlipidemia; Near syncope  Discharged To: TO, ANTICIPATED%>Home independently, Home with family support  Home Treatments: TREATMENTS, ANTICIPATED%>  Devices/Equipment: EQUIPMENT REHAB%>None  Post Hospital Services: HOSPITAL SERVICES%>  Professional Skilled Services: SKILLED SERVICES%>  Therapist, sportspecial Services and Community Resources: SERV AND COMM RES, ANTICIPATED%>  Mode of Discharge Transportation: TRANSPORTATION%>  Discharge Orders          Discharge Patient 09/02/17 16:52:00 EST, Discharge Home/Self Care        Comment:     Medications   During the course of your visit, your medication list was updated with the most current information. The details of those changes are reflected below:          Medications That Have Not Changed  Other Medications  multivitamin with minerals (Centrum Silver oral tablet) 1 Tabs Oral (given by mouth) Monday/Wednesday/Friday.  Last Dose:____________________  naphazoline-pheniramine ophthalmic (Opcon-A 0.027%-0.315% ophthalmic solution) 1 Drops Ophthalmic (the eye) every day as needed allergy symptoms.  Last Dose:____________________  naproxen (Aleve 220 mg oral tablet) 1 Tabs Oral (given by mouth) every 12 hours as needed as needed for pain.  Last Dose:____________________  rosuvastatin (rosuvastatin 5 mg oral tablet) 1 Tabs Oral (given by mouth) every day.  Last Dose:____________________        [facility] would like to thank you for allowing us to assist you with your healthcare needs. The following includes patient education  materials and information regarding your injury/illness.    [facility], Steven May has been given the following list of follow-up instructions, prescriptions, and patient education materials:  Follow-up Instructions:              With: Address: When:   Call [facility] office at [phone] if you have any questions regarding your hospitalization.         With: Address: When:   LARS RUNQUIST-MD [address] [place], [place] [address]  [phone] Business (1) In 1 day 09/03/2017   Comments:   Call [facility] office on 09/03/17 to obtain a 30-day event monitor [place]       With: Address: When:   Primary Care Physician Dr. Durwin NoraWilliam Doughlas Shaw [facility] [phone]([address] [place], [place])  Within 1 to 2 weeks   Comments:   Call for followup appointment from hospitali discharge.                   It is important to always keep an active list of medications available so that you can share with other providers and manage your medications appropriately. As an additional courtesy, we are also providing you with your final active medications list that you can keep with you.           multivitamin with minerals (Centrum Silver oral tablet) 1 Tabs Oral (given by mouth) Monday/Wednesday/Friday.  naphazoline-pheniramine ophthalmic (Opcon-A 0.027%-0.315% ophthalmic solution) 1 Drops Ophthalmic (the eye) every day as needed allergy symptoms.  naproxen (Aleve 220 mg  oral tablet) 1 Tabs Oral (given by mouth) every 12 hours as needed as needed for pain.  rosuvastatin (rosuvastatin 5 mg oral tablet) 1 Tabs Oral (given by mouth) every day.      Take only the medications listed above. Contact your doctor prior to taking any medications not on this list.      Discharge instructions, if any, will display below    Instructions for Diet: INSTRUCTIONS FOR DIET%>A Healthy Diet   Instructions for Supplements: SUPPLEMENT INSTRUCTIONS%>   Instructions for Activity: INSTRUCTIONS FOR ACTIVITY%>Continue  your regular activity   Instructions for Wound Care: INSTRUCTIONS FOR WOUND CARE%>    Medication leaflets, if any, will display below     Patient education materials, if any, will display below          IS IT A STROKE? Act FAST and Check for these signs:    FACE                         Does the face look uneven?    ARM                         Does one arm drift down?    SPEECH                    Does their speech sound strange?    TIME                         Call 9-1-1 at any sign of stroke  Heart Attack Signs  Chest discomfort: Most heart attacks involve discomfort in the center of the chest and lasts more than a few minutes, or goes away and comes back. It can feel like uncomfortable pressure, squeezing, fullness or pain.  Discomfort in upper body: Symptoms can include pain or discomfort in one or both arms, back, neck, jaw or stomach.  Shortness of breath: With or without discomfort.  Other signs: Breaking out in a cold sweat, nausea, or lightheaded.  Remember, MINUTES DO MATTER. If you experience any of these heart attack warning signs, call 9-1-1 to get immediate medical attention!     ---------------------------------------------------------------------------------------------------------------------  [facility] allows you to manage your health, view your test results, and retrieve your discharge documents from your hospital stay securely and conveniently from your computer.  To begin the enrollment process, visit [email or web address] Click on ???Sign up now??? under [facility].

## 2017-09-02 NOTE — Nursing Note (Signed)
Nursing Discharge Summary - Text       Nursing Discharge Summary Entered On:  09/02/2017 18:10 EST    Performed On:  09/02/2017 18:09 EST by Cephus ShellingKane-Eames, RN, Lanora ManisElizabeth               DC Information   Discharge To, Anticipated :   Home with family support   Professional Skilled Services, Anticipated :   No Needs   Transportation :   Private vehicle   Accompanied By :   Myrtie HawkSpouse, Daughter   Mode of Discharge :   Wheelchair   Kane-Eames, RLanora Manis, Elizabeth - 09/02/2017 18:09 EST   Education   Responsible Learner(s) :   Living Situation: Home independently        Performed by: Nelda MarseilleSPRINGER,  COURTNEY R - 09/02/2017 16:56  Discharge To: Home independently, Home with family support        Performed by: Nelda MarseilleSPRINGER,  COURTNEY R - 09/02/2017 16:56     Home Caregiver Present for Session :   Yes   Barriers To Learning :   None evident   Teaching Method :   Demonstration, Explanation, Printed materials, Teach-back   Cephus ShellingKane-Eames, RLanora Manis, Elizabeth - 09/02/2017 18:09 EST   Post-Hospital Education Adult Grid   Activity Expectations :   Verbalizes understanding   Diagnostic Results :   Verbalizes understanding   Disease Process :   Verbalizes understanding   Equipment/Devices :   TEFL teacherVerbalizes understanding   Importance of Follow-Up Visits :   Verbalizes understanding   Plan of Care :   Verbalizes understanding   When to Call Health Care Provider :   Trenton GammonVerbalizes understanding   Cephus ShellingKane-Eames, RLanora Manis, Elizabeth - 09/02/2017 18:09 EST   Health Maintenance Education Adult Grid   Diet/Nutrition :   TEFL teacherVerbalizes understanding   Exercise :   Verbalizes understanding   Cephus ShellingKane-Eames, RLanora Manis, Elizabeth - 09/02/2017 18:09 EST   Medication Education Adult Grid   Med Dosage, Route, Scheduling :   Verbalizes understanding   Cephus ShellingKane-Eames, RLanora Manis, Elizabeth - 09/02/2017 18:09 EST   Education Referral Made To :   Other: cardiology event monitor   Additional Learner(s) Present :   Spouse, Daughter   Time Spent Educating Patient :   45 minutes   Lisette GrinderKane-Eames, RN, Elizabeth - 09/02/2017 18:09 EST

## 2017-09-04 DIAGNOSIS — R55 Syncope and collapse: Secondary | ICD-10-CM | POA: Diagnosis not present

## 2017-09-10 ENCOUNTER — Other Ambulatory Visit: Payer: Self-pay | Admitting: Internal Medicine

## 2017-09-10 ENCOUNTER — Other Ambulatory Visit: Payer: Self-pay

## 2017-09-10 ENCOUNTER — Ambulatory Visit (INDEPENDENT_AMBULATORY_CARE_PROVIDER_SITE_OTHER): Payer: PPO

## 2017-09-10 DIAGNOSIS — R55 Syncope and collapse: Secondary | ICD-10-CM | POA: Diagnosis not present

## 2017-09-10 NOTE — Patient Outreach (Signed)
[facility]) Care Management  09/10/2017  Tyler Waller 11/15/1945 [id number]  Transition of care  Referral date: 09/06/17 Referral source: discharged from [facility]  On 09/02/17 Insurance: Health team advantage  Telephone call to patient regarding transition of care follow up. HIPAA verified with patient. Patient gave verbal authorization to speak with his wife, Jaheim Canino regarding all of his personal health information. Patient and spouse on phone line together at time of call with RNCM.   Discussed transition of care program with patient.  Patient states he was recently in the hospital due to a syncope episode. Patient states he was in the hospital for observation. Patient states, " My MRI, carotid artery, heart tests and monitoring of my blood pressure were fine." Patient states he saw his primary MD on 09/04/17.  Patient states his doctor added baby aspirin to his treatment plan.  Patient denies having any syncope episodes since discharge. Patient states he is scheduled to have a 30 day heart monitor put on today. Patient states he has an appointment with Dr. Sallyanne Kuster on 10/09/17. Patient states he has been instructed by his doctor not to drive. Patient states his wife will provide his transportation. Patient reports he is taking his medications as prescribed. Patient states he does not feel he needs additional transition of care calls. Patient verbally agreed to receive [facility] care management brochure for future use.   PLAN: RNCM will refer patient to care management assistant to close due to patient refusing services.  RNCM will notify patients primary MD of closure.  RNCM will send patient [facility] care management outreach letter/ brochure as discussed.   Quinn Plowman RN,BSN,CCM [facility] Telephonic  [phone]

## 2017-09-11 ENCOUNTER — Encounter: Payer: Self-pay | Admitting: Internal Medicine

## 2017-09-13 ENCOUNTER — Encounter: Payer: Self-pay | Admitting: Internal Medicine

## 2017-10-01 ENCOUNTER — Telehealth: Payer: Self-pay | Admitting: Cardiovascular Disease

## 2017-10-01 DIAGNOSIS — G459 Transient cerebral ischemic attack, unspecified: Secondary | ICD-10-CM | POA: Diagnosis not present

## 2017-10-01 DIAGNOSIS — Z6820 Body mass index (BMI) 20.0-20.9, adult: Secondary | ICD-10-CM | POA: Diagnosis not present

## 2017-10-01 DIAGNOSIS — R55 Syncope and collapse: Secondary | ICD-10-CM | POA: Diagnosis not present

## 2017-10-01 DIAGNOSIS — E7849 Other hyperlipidemia: Secondary | ICD-10-CM | POA: Diagnosis not present

## 2017-10-01 NOTE — Telephone Encounter (Signed)
Spoke with pt, aware we can get all the strips from the monitor at his appt time.

## 2017-10-01 NOTE — Telephone Encounter (Signed)
New message  Patient calling with concerns about NOT having results from event monitor when he comes for Dr Sallyanne Kuster appt. Patient wants to confirm if needs to keep appointment.

## 2017-10-09 ENCOUNTER — Ambulatory Visit: Payer: PPO | Admitting: Cardiovascular Disease

## 2017-10-09 ENCOUNTER — Encounter: Payer: Self-pay | Admitting: Cardiovascular Disease

## 2017-10-09 VITALS — BP 136/86 | HR 88 | Ht 67.0 in | Wt 128.0 lb

## 2017-10-09 DIAGNOSIS — I340 Nonrheumatic mitral (valve) insufficiency: Secondary | ICD-10-CM | POA: Diagnosis not present

## 2017-10-09 DIAGNOSIS — Z9189 Other specified personal risk factors, not elsewhere classified: Secondary | ICD-10-CM

## 2017-10-09 DIAGNOSIS — R55 Syncope and collapse: Secondary | ICD-10-CM

## 2017-10-09 NOTE — Progress Notes (Signed)
Cardiology consultation note:    Date:  10/10/2017   ID:  Tyler Waller, DOB May 12, 1946, MRN [id number]  PCP:  Tyler Redwood, MD  Cardiologist:  No primary care provider on file.   Referring MD: Tyler Redwood, MD   Chief Complaint  Patient presents with  . New Patient (Initial Visit)    discuss 30 day event monitor; no chest pain or other concerns   Tyler Waller is a 72 y.o. male who is being seen today for the evaluation of syncope at the request of Tyler Redwood, MD.   History of Present Illness:    Tyler Waller is a 72 y.o. male with a hx of an episode of syncope while he was in [place] about a month ago.  Tyler Waller and his wife were taking care of their grandchild when she noticed that he developed rather slow and slurred speech.  He did not have any other lateralizing or focal neurological signs.  As they were walking, his wife noticed that he stumbled across the steps.  When he entered the house, he lost consciousness and collapsed to the floor and hit his head.  He was picked up by EMS and brought to the emergency room.  He did not fully recover from confusion for about 2 hours.  He did not have loss of sphincter tone did not have any convulsions.  He has no residual sequelae from this event.  He is also completely amnestic for the events.  He does not remember if he had any prodromal symptoms.  While at [facility] and [place] he reportedly underwent both CT and MRI of the head which were normal and an echocardiogram with bubble study which was also reportedly normal.  He had no rhythm abnormalities on telemetry.    He is currently wearing a 30-day event monitor which will be up tomorrow.  I have the reports from the first 24 days of recording which are quite benign.  Most of the tracings showed normal sinus rhythm.  On one occasion there were 2 isolated PVCs and a single ventricular couplet in close proximity to each other, occurring on February 12 around 6:00 in the  morning when he was probably still asleep..  No sustained arrhythmias seen.  He has not had any symptoms while wearing the monitor.  He had another syncopal event about 20 years ago.  He remembers walking towards the bathroom and falling backwards against his bed, losing consciousness very briefly if at all.  He reports a poorly defined history of "seizures" when he was only 72 years old.  He does not know any details about this.  He is never troubled by palpitations.  He is very lean and fit and exercises a lot.  He likes to hunt and hike.  He does not have daytime hypersomnolence and only scores 3 points on the Epworth Sleepiness Scale.  He never has exertional dyspnea or angina.  He also denies orthopnea, PND leg edema or claudication.  He has relatively mild hypercholesterolemia at 219, but with an outstanding HDL cholesterol 127 and LDL of only 84 he does not have hypertension, diabetes mellitus history of smoking or family history of premature coronary artery disease.  His sister did receive a pacemaker around age of 52.  He has a brother with a known abdominal aortic aneurysm.  His father died of lung cancer in his 64s and his mother died of lymphoma in her late 29s.  He denies knowing that he  has any heart problems, but an echocardiogram performed in 2008 showed mitral valve prolapse with trivial mitral regurgitation.  On exam today he actually has a significant holosystolic murmur at the apex, sounds more than trivial.  I do not have the actual echo report from [place].    His electrocardiogram is completely normal.  This shows sinus rhythm.  The QT interval is 417 ms.  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  Therefore he has a reasonable understanding of medical terminology and pathology.   Past Medical History:  Diagnosis Date  . GERD (gastroesophageal reflux  disease)   . Hyperlipidemia     Past Surgical History:  Procedure Laterality Date  . COLONOSCOPY  2009, 2006, 2002, 2001   Tyler Waller; hx tubular adenomas  . 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 TONSILLECTOMY  1968    Current Medications: Current Meds  Medication Sig  . aspirin EC 81 MG tablet Take 81 mg by mouth daily.  . rosuvastatin (CRESTOR) 5 MG tablet Take 5 mg by mouth 3 (three) times a week.     Allergies:   Patient has no known allergies.   Social History   Socioeconomic History  . Marital status: Married    Spouse name: None  . Number of children: None  . Years of education: None  . Highest education level: None  Social Needs  . Financial resource strain: None  . Food insecurity - worry: None  . Food insecurity - inability: None  . Transportation needs - medical: None  . Transportation needs - non-medical: None  Occupational History  . None  Tobacco Use  . Smoking status: Former Smoker    Last attempt to quit: 07/18/1967    Years since quitting: 50.2  . Smokeless tobacco: Never Used  Substance and Sexual Activity  . Alcohol use: No  . Drug use: No  . Sexual activity: None  Other Topics Concern  . None  Social History Narrative  . None     Family History: The patient's family history includes AAA (abdominal aortic aneurysm) in his brother; Healthy in his daughter and son; Lung cancer in his father; Lymphoma in his mother. There is no history of Colon cancer, Stomach cancer, Esophageal cancer, or Rectal cancer.  ROS:   Please see the history of present illness.     All other systems reviewed and are negative.  EKGs/Labs/Other Studies Reviewed:    The following studies were reviewed today: Notes from Tyler Waller which referred to the studies performed in [place], but I do not have those actual records  EKG:  EKG is  ordered today.  The ekg ordered today demonstrates normal sinus rhythm, normal tracing, QTC 417  ms  Recent Labs: Hemoglobin 16.7, creatinine 0.9, hemoglobin A1c 5.3%, ALT 36, TSH 1.49 Recent Lipid Panel Total cholesterol 219, HDL 127, LDL 84, triglycerides 42  Physical Exam:    VS:  BP 136/86   Pulse 88   Ht 5\' 7"  (1.702 m)   Wt 128 lb (58.1 kg)   BMI 20.05 kg/m     Wt Readings from Last 3 Encounters:  10/09/17 128 lb (58.1 kg)  08/09/12 135 lb (61.2 kg)  07/17/12 135 lb 3.2 oz (61.3 kg)     GEN: Appears very fit for his age, well nourished, well developed in no acute distress HEENT:  Normal NECK: No JVD; No carotid bruits LYMPHATICS: No lymphadenopathy CARDIAC: RRR, there is a faint apical early systolic click in the almost holosystolic 2/6 late peaking murmur at the apex with radiation toward the axilla, no diastolic murmurs, rubs, gallops RESPIRATORY:  Clear to auscultation without rales, wheezing or rhonchi  ABDOMEN: Soft, non-tender, non-distended MUSCULOSKELETAL:  No edema; No deformity  SKIN: Warm and dry NEUROLOGIC:  Alert and oriented x 3 PSYCHIATRIC:  Normal affect   ASSESSMENT:    1. Vasovagal syncope   2. Non-rheumatic mitral regurgitation   3. At risk for coronary artery disease    PLAN:    In order of problems listed above:  1. Syncope: The episode is a little atypical.  It is possible he had a protracted case of vasovagal syncope with a long prodrome.  This was affected with the other episode of loss of consciousness that he had about 20 years earlier.  What is puzzling is the prolonged confusion and his amnesia for the events.  It is possible this was related to his head striking the floor.  The 30-day event monitor is completely unrevealing.  I would strongly recommend implantation of a loop recorder.  It is possible that his prodromal symptoms were related to bradycardia and eventually lost consciousness with a more severe pause.  On the other hand, it is not unreasonable to pursue the seizure diagnosis. ILR procedure has been fully reviewed with the  patient and written informed consent has been obtained.  We will wait for the final tracings on his 30-day event monitor 2. MR: His physical exam is highly suggestive of mitral regurgitation due to mitral valve prolapse.  We will get the echo report from [place].  I can think of how this may have caused loss of consciousness.  I doubt it is related.  It seems to be an asymptomatic abnormality. 3. He has advanced age, male gender, family history of CAD and increased total cholesterol. It is quite reasonable for him to have a plain treadmill stress test to complete the workup for any structural heart disease.  He does not have angina or ECG changes, I do not see any reason to go ahead with a full cardiac catheterization at this point.   Medication Adjustments/Labs and Tests Ordered: Current medicines are reviewed at length with the patient today.  Concerns regarding medicines are outlined above.  Orders Placed This Encounter  Procedures  . EXERCISE TOLERANCE TEST (ETT)  . EKG 12-Lead   No orders of the defined types were placed in this encounter.   Signed, Sanda Klein, MD  10/10/2017 5:53 PM    [facility]

## 2017-10-09 NOTE — Patient Instructions (Signed)
Dr Sallyanne Kuster recommends that you continue on your current medications as directed. Please refer to the Current Medication list given to you today.  Your physician has requested that you have an exercise tolerance test. For further information please visit [email or web address]. Please also follow instruction sheet, as given.  Dr Sallyanne Kuster recommends that you schedule a follow-up appointment in 3 months.  If you need a refill on your cardiac medications before your next appointment, please call your pharmacy.

## 2017-10-09 NOTE — H&P (View-Only) (Signed)
Cardiology consultation note:    Date:  10/10/2017   ID:  Tyler Waller, DOB 22-May-1946, MRN [id number]  PCP:  Tyler Redwood, MD  Cardiologist:  No primary care provider on file.   Referring MD: Tyler Redwood, MD   Chief Complaint  Patient presents with  . New Patient (Initial Visit)    discuss 30 day event monitor; no chest pain or other concerns   Tyler Waller is a 72 y.o. male who is being seen today for the evaluation of syncope at the request of Tyler Redwood, MD.   History of Present Illness:    Tyler Waller is a 72 y.o. male with a hx of an episode of syncope while he was in [place] about a month ago.  Tyler Waller and his wife were taking care of their grandchild when she noticed that he developed rather slow and slurred speech.  He did not have any other lateralizing or focal neurological signs.  As they were walking, his wife noticed that he stumbled across the steps.  When he entered the house, he lost consciousness and collapsed to the floor and hit his head.  He was picked up by EMS and brought to the emergency room.  He did not fully recover from confusion for about 2 hours.  He did not have loss of sphincter tone did not have any convulsions.  He has no residual sequelae from this event.  He is also completely amnestic for the events.  He does not remember if he had any prodromal symptoms.  While at [facility] [place] he reportedly underwent both CT and MRI of the head which were normal and an echocardiogram with bubble study which was also reportedly normal.  He had no rhythm abnormalities on telemetry.    He is currently wearing a 30-day event monitor which will be up tomorrow.  I have the reports from the first 24 days of recording which are quite benign.  Most of the tracings showed normal sinus rhythm.  On one occasion there were 2 isolated PVCs and a single ventricular couplet in close proximity to each other, occurring on February 12 around 6:00 in the  morning when he was probably still asleep..  No sustained arrhythmias seen.  He has not had any symptoms while wearing the monitor.  He had another syncopal event about 20 years ago.  He remembers walking towards the bathroom and falling backwards against his bed, losing consciousness very briefly if at all.  He reports a poorly defined history of "seizures" when he was only 72 years old.  He does not know any details about this.  He is never troubled by palpitations.  He is very lean and fit and exercises a lot.  He likes to hunt and hike.  He does not have daytime hypersomnolence and only scores 3 points on the Epworth Sleepiness Scale.  He never has exertional dyspnea or angina.  He also denies orthopnea, PND leg edema or claudication.  He has relatively mild hypercholesterolemia at 219, but with an outstanding HDL cholesterol 127 and LDL of only 84 he does not have hypertension, diabetes mellitus history of smoking or family history of premature coronary artery disease.  His sister did receive a pacemaker around age of 34.  He has a brother with a known abdominal aortic aneurysm.  His father died of lung cancer in his 61s and his mother died of lymphoma in her late 39s.  He denies knowing that he  has any heart problems, but an echocardiogram performed in 2008 showed mitral valve prolapse with trivial mitral regurgitation.  On exam today he actually has a significant holosystolic murmur at the apex, sounds more than trivial.  I do not have the actual echo report from [place].    His electrocardiogram is completely normal.  This shows sinus rhythm.  The QT interval is 417 ms.  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  Therefore he has a reasonable understanding of medical terminology and pathology.   Past Medical History:  Diagnosis Date  . GERD (gastroesophageal reflux  disease)   . Hyperlipidemia     Past Surgical History:  Procedure Laterality Date  . COLONOSCOPY  2009, 2006, 2002, 2001   Dr. Sharlett Iles; hx tubular adenomas  . 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 TONSILLECTOMY  1968    Current Medications: Current Meds  Medication Sig  . aspirin EC 81 MG tablet Take 81 mg by mouth daily.  . rosuvastatin (CRESTOR) 5 MG tablet Take 5 mg by mouth 3 (three) times a week.     Allergies:   Patient has no known allergies.   Social History   Socioeconomic History  . Marital status: Married    Spouse name: None  . Number of children: None  . Years of education: None  . Highest education level: None  Social Needs  . Financial resource strain: None  . Food insecurity - worry: None  . Food insecurity - inability: None  . Transportation needs - medical: None  . Transportation needs - non-medical: None  Occupational History  . None  Tobacco Use  . Smoking status: Former Smoker    Last attempt to quit: 07/18/1967    Years since quitting: 50.2  . Smokeless tobacco: Never Used  Substance and Sexual Activity  . Alcohol use: No  . Drug use: No  . Sexual activity: None  Other Topics Concern  . None  Social History Narrative  . None     Family History: The patient's family history includes AAA (abdominal aortic aneurysm) in his brother; Healthy in his daughter and son; Lung cancer in his father; Lymphoma in his mother. There is no history of Colon cancer, Stomach cancer, Esophageal cancer, or Rectal cancer.  ROS:   Please see the history of present illness.     All other systems reviewed and are negative.  EKGs/Labs/Other Studies Reviewed:    The following studies were reviewed today: Notes from Dr. Manuella Ghazi which referred to the studies performed in [place], but I do not have those actual records  EKG:  EKG is  ordered today.  The ekg ordered today demonstrates normal sinus rhythm, normal tracing, QTC 417  ms  Recent Labs: Hemoglobin 16.7, creatinine 0.9, hemoglobin A1c 5.3%, ALT 36, TSH 1.49 Recent Lipid Panel Total cholesterol 219, HDL 127, LDL 84, triglycerides 42  Physical Exam:    VS:  BP 136/86   Pulse 88   Ht 5\' 7"  (1.702 m)   Wt 128 lb (58.1 kg)   BMI 20.05 kg/m     Wt Readings from Last 3 Encounters:  10/09/17 128 lb (58.1 kg)  08/09/12 135 lb (61.2 kg)  07/17/12 135 lb 3.2 oz (61.3 kg)     GEN: Appears very fit for his age, well nourished, well developed in no acute distress HEENT:  Normal NECK: No JVD; No carotid bruits LYMPHATICS: No lymphadenopathy CARDIAC: RRR, there is a faint apical early systolic click in the almost holosystolic 2/6 late peaking murmur at the apex with radiation toward the axilla, no diastolic murmurs, rubs, gallops RESPIRATORY:  Clear to auscultation without rales, wheezing or rhonchi  ABDOMEN: Soft, non-tender, non-distended MUSCULOSKELETAL:  No edema; No deformity  SKIN: Warm and dry NEUROLOGIC:  Alert and oriented x 3 PSYCHIATRIC:  Normal affect   ASSESSMENT:    1. Vasovagal syncope   2. Non-rheumatic mitral regurgitation   3. At risk for coronary artery disease    PLAN:    In order of problems listed above:  1. Syncope: The episode is a little atypical.  It is possible he had a protracted case of vasovagal syncope with a long prodrome.  This was affected with the other episode of loss of consciousness that he had about 20 years earlier.  What is puzzling is the prolonged confusion and his amnesia for the events.  It is possible this was related to his head striking the floor.  The 30-day event monitor is completely unrevealing.  I would strongly recommend implantation of a loop recorder.  It is possible that his prodromal symptoms were related to bradycardia and eventually lost consciousness with a more severe pause.  On the other hand, it is not unreasonable to pursue the seizure diagnosis. ILR procedure has been fully reviewed with the  patient and written informed consent has been obtained.  We will wait for the final tracings on his 30-day event monitor 2. MR: His physical exam is highly suggestive of mitral regurgitation due to mitral valve prolapse.  We will get the echo report from [place].  I can think of how this may have caused loss of consciousness.  I doubt it is related.  It seems to be an asymptomatic abnormality. 3. He has advanced age, male gender, family history of CAD and increased total cholesterol. It is quite reasonable for him to have a plain treadmill stress test to complete the workup for any structural heart disease.  He does not have angina or ECG changes, I do not see any reason to go ahead with a full cardiac catheterization at this point.   Medication Adjustments/Labs and Tests Ordered: Current medicines are reviewed at length with the patient today.  Concerns regarding medicines are outlined above.  Orders Placed This Encounter  Procedures  . EXERCISE TOLERANCE TEST (ETT)  . EKG 12-Lead   No orders of the defined types were placed in this encounter.   Signed, Tyler Klein, MD  10/10/2017 5:53 PM    [facility]

## 2017-10-10 DIAGNOSIS — R55 Syncope and collapse: Secondary | ICD-10-CM | POA: Insufficient documentation

## 2017-10-10 DIAGNOSIS — I340 Nonrheumatic mitral (valve) insufficiency: Secondary | ICD-10-CM | POA: Insufficient documentation

## 2017-10-11 ENCOUNTER — Telehealth (HOSPITAL_COMMUNITY): Payer: Self-pay

## 2017-10-11 NOTE — Telephone Encounter (Signed)
Encounter complete. 

## 2017-10-16 ENCOUNTER — Ambulatory Visit (HOSPITAL_COMMUNITY)
Admission: RE | Admit: 2017-10-16 | Discharge: 2017-10-16 | Disposition: A | Discharge status: Home / Self Care | Payer: PPO | Source: Ambulatory Visit | Attending: Cardiovascular Disease | Admitting: Cardiovascular Disease

## 2017-10-16 DIAGNOSIS — Z9189 Other specified personal risk factors, not elsewhere classified: Secondary | ICD-10-CM | POA: Insufficient documentation

## 2017-10-16 LAB — EXERCISE TOLERANCE TEST
CHL RATE OF PERCEIVED EXERTION: 18
CSEPEDS: 0 s
CSEPEW: 10.1 METS
Exercise duration (min): 9 min
MPHR: 149 {beats}/min
Peak HR: 179 {beats}/min
Percent HR: 120 %
Rest HR: 96 {beats}/min

## 2017-10-17 ENCOUNTER — Telehealth: Payer: Self-pay | Admitting: Cardiovascular Disease

## 2017-10-17 DIAGNOSIS — R972 Elevated prostate specific antigen [PSA]: Secondary | ICD-10-CM | POA: Diagnosis not present

## 2017-10-17 NOTE — Telephone Encounter (Signed)
Patient informed of stress test and event monitor results.  Patient is ready to proceed with ILR. Please advise.  Patient said he has not been able to drive for 6 weeks and wants to know if he can drive now and wants to know if its safe for him to be left alone. Please advise.

## 2017-10-17 NOTE — Telephone Encounter (Signed)
ETT-Notes recorded by Sanda Klein, MD on 10/17/2017 at 9:51 AM EDT Normal stress test. He can increase his heart rate like a 72 year old!  Event monitor:Normal 30-day event monitor with very rare ectopic beats. Palpitations were not associated with any objective arrhythmia. Could be did not occur during the recording period.

## 2017-10-17 NOTE — Telephone Encounter (Signed)
Patient aware of Dr Lurline Del recommendations as outlined below.  Patient agreeable to loop recorder implant. Scheduled for Friday, 10/19/17 at 1:30p. He will stop by the office this afternoon to pick up surgical scrub and instruction letter.  Echo report still pending. Patient reports that he was told "it looks fabulous" before he was discharged from hospital in [place]. However, medical records personnel will request report again.

## 2017-10-17 NOTE — Telephone Encounter (Signed)
Follow up ° °Patient is returning call in reference to labs. Please call.   °

## 2017-10-17 NOTE — Telephone Encounter (Signed)
Mr. Hinderer is calling to get results of his test explained to him .

## 2017-10-17 NOTE — Telephone Encounter (Signed)
I think he can drive.  We can set up for loop recorder this Friday or next Wednesday afternoon. Does not need labs. Does not need to be NPO. [place], did we ever get his echo report? MCr

## 2017-10-19 ENCOUNTER — Ambulatory Visit (HOSPITAL_COMMUNITY)
Admission: RE | Admit: 2017-10-19 | Discharge: 2017-10-19 | Disposition: A | Discharge status: Home / Self Care | Payer: PPO | Source: Ambulatory Visit | Attending: Cardiovascular Disease | Admitting: Cardiovascular Disease

## 2017-10-19 ENCOUNTER — Encounter (HOSPITAL_COMMUNITY)
Admission: RE | Disposition: A | Discharge status: Home / Self Care | Payer: Self-pay | Source: Ambulatory Visit | Attending: Cardiovascular Disease

## 2017-10-19 DIAGNOSIS — I341 Nonrheumatic mitral (valve) prolapse: Secondary | ICD-10-CM | POA: Diagnosis not present

## 2017-10-19 DIAGNOSIS — I493 Ventricular premature depolarization: Secondary | ICD-10-CM | POA: Diagnosis not present

## 2017-10-19 DIAGNOSIS — Z7982 Long term (current) use of aspirin: Secondary | ICD-10-CM | POA: Insufficient documentation

## 2017-10-19 DIAGNOSIS — R55 Syncope and collapse: Secondary | ICD-10-CM | POA: Insufficient documentation

## 2017-10-19 DIAGNOSIS — Z8249 Family history of ischemic heart disease and other diseases of the circulatory system: Secondary | ICD-10-CM | POA: Diagnosis not present

## 2017-10-19 DIAGNOSIS — I340 Nonrheumatic mitral (valve) insufficiency: Secondary | ICD-10-CM

## 2017-10-19 DIAGNOSIS — Z79899 Other long term (current) drug therapy: Secondary | ICD-10-CM | POA: Diagnosis not present

## 2017-10-19 DIAGNOSIS — Z8669 Personal history of other diseases of the nervous system and sense organs: Secondary | ICD-10-CM | POA: Insufficient documentation

## 2017-10-19 DIAGNOSIS — Z87891 Personal history of nicotine dependence: Secondary | ICD-10-CM | POA: Insufficient documentation

## 2017-10-19 DIAGNOSIS — E7800 Pure hypercholesterolemia, unspecified: Secondary | ICD-10-CM | POA: Diagnosis not present

## 2017-10-19 HISTORY — PX: LOOP RECORDER INSERTION: EP1214

## 2017-10-19 SURGERY — LOOP RECORDER INSERTION

## 2017-10-19 MED ORDER — LIDOCAINE HCL (PF) 1 % IJ SOLN
INTRAMUSCULAR | Status: AC
  Filled 2017-10-19: qty 30

## 2017-10-19 MED ORDER — LIDOCAINE HCL (PF) 1 % IJ SOLN
INTRAMUSCULAR | Status: DC | PRN
  Administered 2017-10-19: 15 mL

## 2017-10-19 SURGICAL SUPPLY — 2 items
LOOP REVEAL LINQSYS (Prosthesis & Implant Heart) ×2 IMPLANT
PACK LOOP INSERTION (CUSTOM PROCEDURE TRAY) ×3 IMPLANT

## 2017-10-19 NOTE — Interval H&P Note (Signed)
History and Physical Interval Note:  10/19/2017 1:45 PM  [name]  has presented today for surgery, with the diagnosis of snycope  The various methods of treatment have been discussed with the patient and family. After consideration of risks, benefits and other options for treatment, the patient has consented to  Procedure(s): LOOP RECORDER INSERTION (N/A) as a surgical intervention .  The patient's history has been reviewed, patient examined, no change in status, stable for surgery.  I have reviewed the patient's chart and labs.  Questions were answered to the patient's satisfaction.     Gatsby Chismar

## 2017-10-19 NOTE — Op Note (Signed)
LOOP RECORDER IMPLANT   Procedure report  Procedure performed:  Loop recorder implantation   Reason for procedure:  Recurrent syncope/near-syncope  Procedure performed by:  Sanda Klein, MD  Complications:  None  Estimated blood loss:  <5 mL  Medications administered during procedure:  Lidocaine 1% with 1/10,000 epinephrine 10 mL locally Device details:  Medtronic Reveal Linq model number [id number], serial number [id number] Procedure details:  After the risks and benefits of the procedure were discussed the patient provided informed consent. The patient was prepped and draped in usual sterile fashion. Local anesthesia was administered to an area 2 cm to the left of the sternum in the 4th intercostal space. A cutaneous incision was made using the incision tool. The introducer was then used to create a subcutaneous tunnel and carefully deploy the device. Local pressure was held to ensure hemostasis.  The incision was closed with SteriStrips and a sterile dressing was applied.   R waves 0.4 mV  Sanda Klein, MD, [facility] [phone] office [phone] pager 10/19/2017 2:49 PM

## 2017-10-19 NOTE — Discharge Instructions (Signed)

## 2017-10-22 ENCOUNTER — Encounter (HOSPITAL_COMMUNITY): Payer: Self-pay | Admitting: Cardiovascular Disease

## 2017-11-01 ENCOUNTER — Ambulatory Visit: Payer: PPO

## 2017-11-01 DIAGNOSIS — L720 Epidermal cyst: Secondary | ICD-10-CM | POA: Diagnosis not present

## 2017-11-01 DIAGNOSIS — D692 Other nonthrombocytopenic purpura: Secondary | ICD-10-CM | POA: Diagnosis not present

## 2017-11-01 DIAGNOSIS — L570 Actinic keratosis: Secondary | ICD-10-CM | POA: Diagnosis not present

## 2017-11-01 DIAGNOSIS — D2271 Melanocytic nevi of right lower limb, including hip: Secondary | ICD-10-CM | POA: Diagnosis not present

## 2017-11-01 DIAGNOSIS — D225 Melanocytic nevi of trunk: Secondary | ICD-10-CM | POA: Diagnosis not present

## 2017-11-01 DIAGNOSIS — Z85828 Personal history of other malignant neoplasm of skin: Secondary | ICD-10-CM | POA: Diagnosis not present

## 2017-11-01 DIAGNOSIS — L814 Other melanin hyperpigmentation: Secondary | ICD-10-CM | POA: Diagnosis not present

## 2017-11-01 DIAGNOSIS — D1801 Hemangioma of skin and subcutaneous tissue: Secondary | ICD-10-CM | POA: Diagnosis not present

## 2017-11-01 DIAGNOSIS — L821 Other seborrheic keratosis: Secondary | ICD-10-CM | POA: Diagnosis not present

## 2017-11-06 ENCOUNTER — Ambulatory Visit (INDEPENDENT_AMBULATORY_CARE_PROVIDER_SITE_OTHER): Payer: Self-pay | Admitting: *Deleted

## 2017-11-06 DIAGNOSIS — R55 Syncope and collapse: Secondary | ICD-10-CM

## 2017-11-06 LAB — CUP PACEART INCLINIC DEVICE CHECK
Date Time Interrogation Session: 20190402171103
MDC IDC PG IMPLANT DT: 20190315

## 2017-11-06 NOTE — Progress Notes (Signed)
Wound check appointment. Steri-strips removed. Wound without redness or edema. Incision edges approximated, wound well healed. Normal device function. Battery status: good. R-waves 0.77mV. No symptom, tachy, pause, brady, or AF episodes. Patient educated about wound care, Carelink monitor, and symptom activator. Patient used symptom activator in clinic to ensure understanding. Monthly summary reports and ROV with [place] on 01/15/18.

## 2017-11-06 NOTE — Patient Instructions (Addendum)
Call the [facility] at [phone] if you use your symptom activator so that we can discuss your symptoms and review the episode.

## 2017-11-21 ENCOUNTER — Ambulatory Visit (INDEPENDENT_AMBULATORY_CARE_PROVIDER_SITE_OTHER): Payer: PPO | Admitting: *Deleted

## 2017-11-21 DIAGNOSIS — R55 Syncope and collapse: Secondary | ICD-10-CM | POA: Diagnosis not present

## 2017-11-22 NOTE — Progress Notes (Signed)
Carelink Summary Report / Loop Recorder 

## 2017-12-21 LAB — CUP PACEART REMOTE DEVICE CHECK
Date Time Interrogation Session: 20190417180559
MDC IDC PG IMPLANT DT: 20190315

## 2017-12-24 ENCOUNTER — Ambulatory Visit (INDEPENDENT_AMBULATORY_CARE_PROVIDER_SITE_OTHER): Payer: PPO | Admitting: *Deleted

## 2017-12-24 DIAGNOSIS — R55 Syncope and collapse: Secondary | ICD-10-CM

## 2017-12-25 NOTE — Progress Notes (Signed)
Carelink Summary Report / Loop Recorder 

## 2018-01-15 ENCOUNTER — Encounter: Payer: Self-pay | Admitting: Cardiovascular Disease

## 2018-01-15 ENCOUNTER — Ambulatory Visit (INDEPENDENT_AMBULATORY_CARE_PROVIDER_SITE_OTHER): Payer: PPO | Admitting: Cardiovascular Disease

## 2018-01-15 VITALS — BP 138/72 | HR 80 | Ht 67.0 in | Wt 129.0 lb

## 2018-01-15 DIAGNOSIS — R55 Syncope and collapse: Secondary | ICD-10-CM

## 2018-01-15 DIAGNOSIS — Z95818 Presence of other cardiac implants and grafts: Secondary | ICD-10-CM | POA: Diagnosis not present

## 2018-01-15 DIAGNOSIS — E7800 Pure hypercholesterolemia, unspecified: Secondary | ICD-10-CM | POA: Diagnosis not present

## 2018-01-15 DIAGNOSIS — I340 Nonrheumatic mitral (valve) insufficiency: Secondary | ICD-10-CM

## 2018-01-15 LAB — CUP PACEART REMOTE DEVICE CHECK
Date Time Interrogation Session: 20190520180529
MDC IDC PG IMPLANT DT: 20190315

## 2018-01-15 NOTE — Patient Instructions (Signed)
Dr Croitoru recommends that you schedule a follow-up appointment in 12 months. You will receive a reminder letter in the mail two months in advance. If you don't receive a letter, please call our office to schedule the follow-up appointment.  If you need a refill on your cardiac medications before your next appointment, please call your pharmacy. 

## 2018-01-15 NOTE — Progress Notes (Signed)
Cardiology office note:    Date:  01/15/2018   ID:  Tyler Waller, DOB Jul 27, 1946, MRN [id number]  PCP:  Tyler Redwood, MD  Cardiologist:  Tyler Waller  Referring MD: Tyler Redwood, MD   Chief Complaint  Patient presents with  . Follow-up  Syncope/ILR   History of Present Illness:    Tyler Waller is a 72 y.o. male with a hx of an episode of syncope earlier this year and s/p ILR implantation in March 2019.  His loop recorder implantation he has felt well and has not had any new syncopal events.  He denies any cardiovascular complaints.  He finds it hard to increase his intake of sodium, he does not like salty foods.  The patient specifically denies any chest pain at rest exertion, dyspnea at rest or with exertion, orthopnea, paroxysmal nocturnal dyspnea, syncope, palpitations, focal neurological deficits, intermittent claudication, lower extremity edema, unexplained weight gain, cough, hemoptysis or wheezing.  The his loop recorder has not shown any evidence of arrhythmia and he has not recorded any symptom driven events.. He has a very broad splay between daytime and nighttime heart rates.  Activity level is constant at roughly 2.5 hours a day.  Tyler Waller and his wife were taking care of their grandchild when she noticed that he developed rather slow and slurred speech.  He did not have any other lateralizing or focal neurological signs.  As they were walking, his wife noticed that he stumbled across the steps.  When he entered the house, he lost consciousness and collapsed to the floor and hit his head.  He was picked up by EMS and brought to the emergency room.  He did not fully recover from confusion for about 2 hours.  He did not have loss of sphincter tone did not have any convulsions.  He has no residual sequelae from this event.  He is also completely amnestic for the events.  He does not remember if he had any prodromal symptoms.  While at [facility] and [place] he  reportedly underwent both CT and MRI of the head which were normal and an echocardiogram with bubble study which was also reportedly normal.  He had no rhythm abnormalities on telemetry.    He is currently wearing a 30-day event monitor which will be up tomorrow.  I have the reports from the first 24 days of recording which are quite benign.  Most of the tracings showed normal sinus rhythm.  On one occasion there were 2 isolated PVCs and a single ventricular couplet in close proximity to each other, occurring on February 12 around 6:00 in the morning when he was probably still asleep..  No sustained arrhythmias seen.  He has not had any symptoms while wearing the monitor.  He had another syncopal event about 20 years ago.  He remembers walking towards the bathroom and falling backwards against his bed, losing consciousness very briefly if at all.  He reports a poorly defined history of "seizures" when he was only 72 years old.  He does not know any details about this.  He is never troubled by palpitations.  He is very lean and fit and exercises a lot.  He likes to hunt and hike.  He does not have daytime hypersomnolence and only scores 3 points on the Epworth Sleepiness Scale.  He never has exertional dyspnea or angina.  He also denies orthopnea, PND leg edema or claudication.  He has relatively mild hypercholesterolemia at 219, but with an  outstanding HDL cholesterol 127 and LDL of only 84 he does not have hypertension, diabetes mellitus history of smoking or family history of premature coronary artery disease.  His sister did receive a pacemaker around age of 28.  He has a brother with a known abdominal aortic aneurysm.  His father died of lung cancer in his 73s and his mother died of lymphoma in her late 65s.  He denies knowing that he has any heart problems, but an echocardiogram performed in 2008 showed mitral valve prolapse with trivial mitral regurgitation.  On exam today he actually has a  significant holosystolic murmur at the apex, sounds more than trivial.  I do not have the actual echo report from [place].    His electrocardiogram is completely normal.  This shows sinus rhythm.  The QT interval is 417 ms.  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  Therefore he has a reasonable understanding of medical terminology and pathology.   Past Medical History:  Diagnosis Date  . GERD (gastroesophageal reflux disease)   . Hyperlipidemia     Past Surgical History:  Procedure Laterality Date  . COLONOSCOPY  2009, 2006, 2002, 2001   Dr. Sharlett Iles; hx tubular adenomas  . 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 LOOP RECORDER INSERTION N/A 10/19/2017   Procedure: LOOP RECORDER INSERTION;  Surgeon: Sanda Klein, MD;  Location: [facility];  Service: Cardiovascular;  Laterality: N/A;  . TONSILLECTOMY  1968    Current Medications: Current Meds  Medication Sig  . aspirin EC 81 MG tablet Take 81 mg by mouth daily.  Marland Kitchen ibuprofen (ADVIL,MOTRIN) 200 MG tablet Take 400 mg by mouth every 6 (six) hours as needed for headache or moderate pain.  . Multiple Vitamins-Minerals (CENTRUM SILVER ULTRA MENS PO) Take 1 tablet by mouth every Monday, Wednesday, and Friday.  . Naphazoline-Pheniramine (OPCON-A) 0.027-0.315 % SOLN Place 2 drops into both eyes daily.  . rosuvastatin (CRESTOR) 5 MG tablet Take 5 mg by mouth daily.      Allergies:   Patient has no known allergies.   Social History   Socioeconomic History  . Marital status: Married    Spouse name: Not on file  . Number of children: Not on file  . Years of education: Not on file  . Highest education level: Not on file  Occupational History  . Not on file  Social Needs  . Financial resource strain: Not on file  . Food insecurity:    Worry: Not on file    Inability: Not on file    . Transportation needs:    Medical: Not on file    Non-medical: Not on file  Tobacco Use  . Smoking status: Former Smoker    Last attempt to quit: 07/18/1967    Years since quitting: 50.5  . Smokeless tobacco: Never Used  Substance and Sexual Activity  . Alcohol use: No  . Drug use: No  . Sexual activity: Not on file  Lifestyle  . Physical activity:    Days per week: Not on file    Minutes per session: Not on file  . Stress: Not on file  Relationships  . Social connections:    Talks on phone: Not on file    Gets together: Not on file    Attends religious service: Not on file    Active  member of club or organization: Not on file    Attends meetings of clubs or organizations: Not on file    Relationship status: Not on file  Other Topics Concern  . Not on file  Social History Narrative  . Not on file     Family History: The patient's family history includes AAA (abdominal aortic aneurysm) in his brother; Healthy in his daughter and son; Lung cancer in his father; Lymphoma in his mother. There is no history of Colon cancer, Stomach cancer, Esophageal cancer, or Rectal cancer.  ROS:   Please see the history of present illness.     All other systems reviewed and are negative.  EKGs/Labs/Other Studies Reviewed:    The following studies were reviewed today: Notes from Dr. Manuella Ghazi which referred to the studies performed in [place], but I do not have those actual records  EKG:  EKG is not ordered today.    Recent Labs: Hemoglobin 16.7, creatinine 0.9, hemoglobin A1c 5.3%, ALT 36, TSH 1.49 Recent Lipid Panel Total cholesterol 219, HDL 127, LDL 84, triglycerides 42  Physical Exam:    VS:  BP 138/72   Pulse 80   Ht 5\' 7"  (1.702 m)   Wt 129 lb (58.5 kg)   BMI 20.20 kg/m      Wt Readings from Last 3 Encounters:  01/15/18 129 lb (58.5 kg)  10/19/17 125 lb (56.7 kg)  10/09/17 128 lb (58.1 kg)     General: Alert, oriented x3, no distress, Very lean and fit Head: no  evidence of trauma, PERRL, EOMI, no exophtalmos or lid lag, no myxedema, no xanthelasma; normal ears, nose and oropharynx Neck: normal jugular venous pulsations and no hepatojugular reflux; brisk carotid pulses without delay and no carotid bruits Chest: clear to auscultation, no signs of consolidation by percussion or palpation, normal fremitus, symmetrical and full respiratory excursions.  Well-healed scar at the loop recorder site. Cardiovascular: normal position and quality of the apical impulse, regular rhythm, normal first and second heart sounds, no murmurs, rubs or gallops Abdomen: no tenderness or distention, no masses by palpation, no abnormal pulsatility or arterial bruits, normal bowel sounds, no hepatosplenomegaly Extremities: no clubbing, cyanosis or edema; 2+ radial, ulnar and brachial pulses bilaterally; 2+ right femoral, posterior tibial and dorsalis pedis pulses; 2+ left femoral, posterior tibial and dorsalis pedis pulses; no subclavian or femoral bruits Neurological: grossly nonfocal Psych: Normal mood and affect   ASSESSMENT:    1. Vasovagal syncope   2. Status post placement of implantable loop recorder   3. Non-rheumatic mitral regurgitation   4. Hypercholesterolemia    PLAN:    In order of problems listed above:  1. Syncope: He has had 2 unusual syncopal events roughly 20 years apart.  No recent events. 2. ILR: Normal device function.  Excellent signal with R waves around 0.8 mV and a very distinct visible P waves. 3. MR: His physical exam is highly suggestive of mitral regurgitation due to mitral valve prolapse.  The echo from [facility] in [place] showed "mild mitral regurgitation" without mention of prolapse or myxomatous changes.  The left ventricle was normal in size and function.  Further evaluation does not appear necessary since he is asymptomatic. 4. Hypercholesterolemia : he has advanced age, male gender, family history of CAD and increased total  cholesterol.  Normal treadmill stress test on October 16, 2017.  On statin.   Medication Adjustments/Labs and Tests Ordered: Current medicines are reviewed at length with the patient today.  Concerns regarding  medicines are outlined above.  No orders of the defined types were placed in this encounter.  No orders of the defined types were placed in this encounter.   Signed, Sanda Klein, MD  01/15/2018 9:55 AM    [facility]

## 2018-01-28 ENCOUNTER — Ambulatory Visit (INDEPENDENT_AMBULATORY_CARE_PROVIDER_SITE_OTHER): Payer: PPO | Admitting: *Deleted

## 2018-01-28 DIAGNOSIS — R55 Syncope and collapse: Secondary | ICD-10-CM

## 2018-01-28 NOTE — Progress Notes (Signed)
Carelink Summary Report / Loop Recorder 

## 2018-02-28 ENCOUNTER — Ambulatory Visit (INDEPENDENT_AMBULATORY_CARE_PROVIDER_SITE_OTHER): Payer: PPO | Admitting: *Deleted

## 2018-02-28 DIAGNOSIS — R55 Syncope and collapse: Secondary | ICD-10-CM | POA: Diagnosis not present

## 2018-03-01 NOTE — Progress Notes (Signed)
Carelink Summary Report / Loop Recorder 

## 2018-03-06 LAB — CUP PACEART REMOTE DEVICE CHECK
Date Time Interrogation Session: 20190622194148
MDC IDC PG IMPLANT DT: 20190315

## 2018-03-25 ENCOUNTER — Encounter: Payer: Self-pay | Admitting: Internal Medicine

## 2018-04-02 ENCOUNTER — Ambulatory Visit (INDEPENDENT_AMBULATORY_CARE_PROVIDER_SITE_OTHER): Payer: PPO | Admitting: *Deleted

## 2018-04-02 DIAGNOSIS — R55 Syncope and collapse: Secondary | ICD-10-CM | POA: Diagnosis not present

## 2018-04-03 NOTE — Progress Notes (Signed)
Carelink Summary Report / Loop Recorder 

## 2018-04-15 LAB — CUP PACEART REMOTE DEVICE CHECK
Date Time Interrogation Session: 20190725200537
MDC IDC PG IMPLANT DT: 20190315

## 2018-04-30 LAB — CUP PACEART REMOTE DEVICE CHECK
Implantable Pulse Generator Implant Date: 20190315
MDC IDC SESS DTM: 20190827203928

## 2018-05-06 ENCOUNTER — Ambulatory Visit (INDEPENDENT_AMBULATORY_CARE_PROVIDER_SITE_OTHER): Payer: PPO | Admitting: *Deleted

## 2018-05-06 DIAGNOSIS — R55 Syncope and collapse: Secondary | ICD-10-CM

## 2018-05-06 NOTE — Progress Notes (Signed)
Carelink Summary Report / Loop Recorder 

## 2018-05-08 LAB — CUP PACEART REMOTE DEVICE CHECK
Implantable Pulse Generator Implant Date: 20190315
MDC IDC SESS DTM: 20190929213841

## 2018-05-16 ENCOUNTER — Ambulatory Visit (AMBULATORY_SURGERY_CENTER): Payer: Self-pay | Admitting: *Deleted

## 2018-05-16 VITALS — Ht 67.0 in | Wt 129.0 lb

## 2018-05-16 DIAGNOSIS — Z86010 Personal history of colonic polyps: Secondary | ICD-10-CM

## 2018-05-16 MED ORDER — PEG 3350-KCL-NA BICARB-NACL 420 G PO SOLR: 4000.0000 mL | Freq: Once | ORAL | 0 refills | Status: AC

## 2018-05-16 NOTE — Progress Notes (Signed)
Patient denies any allergies to eggs or soy. Patient denies any problems with anesthesia/sedation. Patient denies any oxygen use at home. Patient denies taking any diet/weight loss medications or blood thinners. EMMI education offered, pt declined. Patient explains that he has the loop recorder in place but has NOT had any irregular heart beats or syncope since the one time on January 26,2018.  Patient is aware to call us if anything medically changes from today.

## 2018-05-20 ENCOUNTER — Encounter: Payer: Self-pay | Admitting: Internal Medicine

## 2018-05-27 ENCOUNTER — Telehealth: Payer: Self-pay | Admitting: *Deleted

## 2018-05-27 NOTE — Telephone Encounter (Signed)
LMOVM requesting call back to the [facility], gave direct number.  Tachy episode noted from 05/22/18 at 2031, duration 26sec, median V rate 171bpm. ECG appears SVT. Will ask patient if symptomatic with episode.

## 2018-05-27 NOTE — Telephone Encounter (Signed)
Spoke with patient. He reports he was out of town on a hunting trip for ducks and geese. He was asymptomatic with the episode. Advised that Dr. Sallyanne Kuster will review the episode and we will call back if any recommendations or changes. Patient is agreeable to this plan and is appreciative of call.  Routed to Dr. Sallyanne Kuster for review, ECG placed in Dr. Langley Gauss folder.

## 2018-05-27 NOTE — Telephone Encounter (Signed)
The pt returned [place] phone call. I told the pt that Raquel Sarna would give him a call back when she get a chance.

## 2018-05-28 NOTE — Telephone Encounter (Signed)
Paroxysmal atrial tachycardia, no change in therapy. Thank you

## 2018-06-03 ENCOUNTER — Ambulatory Visit (AMBULATORY_SURGERY_CENTER): Payer: PPO | Admitting: Internal Medicine

## 2018-06-03 ENCOUNTER — Encounter: Payer: Self-pay | Admitting: Internal Medicine

## 2018-06-03 VITALS — BP 131/75 | HR 59 | Temp 97.7°F | Resp 9 | Ht 67.0 in | Wt 129.0 lb

## 2018-06-03 DIAGNOSIS — Z1211 Encounter for screening for malignant neoplasm of colon: Secondary | ICD-10-CM | POA: Diagnosis not present

## 2018-06-03 DIAGNOSIS — D125 Benign neoplasm of sigmoid colon: Secondary | ICD-10-CM

## 2018-06-03 DIAGNOSIS — D123 Benign neoplasm of transverse colon: Secondary | ICD-10-CM

## 2018-06-03 DIAGNOSIS — Z860100 Personal history of colon polyps, unspecified: Secondary | ICD-10-CM

## 2018-06-03 DIAGNOSIS — K635 Polyp of colon: Secondary | ICD-10-CM

## 2018-06-03 MED ORDER — SODIUM CHLORIDE 0.9 % IV SOLN: 500.0000 mL | Freq: Once | INTRAVENOUS | Status: DC

## 2018-06-03 NOTE — Op Note (Signed)
[facility] Patient Name: Tyler Waller Procedure Date: 06/03/2018 9:11 AM MRN: [id number] Endoscopist: Jerene Bears , MD Age: 72 Referring MD:  Date of Birth: May 05, 1946 Gender: Male Account #: [id number] Procedure:                Colonoscopy Indications:              Surveillance: Personal history of adenomatous                            polyps on last colonoscopy 5 years ago Medicines:                Monitored Anesthesia Care Procedure:                Pre-Anesthesia Assessment:                           - Prior to the procedure, a History and Physical                            was performed, and patient medications and                            allergies were reviewed. The patient's tolerance of                            previous anesthesia was also reviewed. The risks                            and benefits of the procedure and the sedation                            options and risks were discussed with the patient.                            All questions were answered, and informed consent                            was obtained. Prior Anticoagulants: The patient has                            taken no previous anticoagulant or antiplatelet                            agents. ASA Grade Assessment: II - A patient with                            mild systemic disease. After reviewing the risks                            and benefits, the patient was deemed in                            satisfactory condition to undergo the procedure.  After obtaining informed consent, the colonoscope                            was passed under direct vision. Throughout the                            procedure, the patient's blood pressure, pulse, and                            oxygen saturations were monitored continuously. The                            Colonoscope was introduced through the anus and                            advanced to the cecum,  identified by appendiceal                            orifice and ileocecal valve. The colonoscopy was                            performed without difficulty. The patient tolerated                            the procedure well. The quality of the bowel                            preparation was good. The ileocecal valve,                            appendiceal orifice, and rectum were photographed. Scope In: 9:20:35 AM Scope Out: 9:46:32 AM Scope Withdrawal Time: 0 hours 21 minutes 54 seconds  Total Procedure Duration: 0 hours 25 minutes 57 seconds  Findings:                 The digital rectal exam was normal.                           Three sessile polyps were found in the hepatic                            flexure. The polyps were 3 to 5 mm in size. These                            polyps were removed with a cold snare. Resection                            and retrieval were complete.                           A 5 mm polyp was found in the transverse colon. The                            polyp was sessile.  The polyp was removed with a                            cold snare. Resection and retrieval were complete.                           A 15 mm polyp was found in the transverse colon.                            The polyp was flat. The polyp was removed with a                            cold snare. Resection and retrieval were complete.                            After polypectomy there was active oozing from the                            polypectomy base. For hemostasis, one hemostatic                            clip was successfully placed with good result.                            There was no bleeding at the end of the maneuver.                           A 4 mm polyp was found in the sigmoid colon. The                            polyp was sessile. The polyp was removed with a                            cold snare. Resection and retrieval were complete.                           The  retroflexed view of the distal rectum and anal                            verge was normal and showed no anal or rectal                            abnormalities. Complications:            No immediate complications. Estimated Blood Loss:     Estimated blood loss was minimal. Impression:               - Three 3 to 5 mm polyps at the hepatic flexure,                            removed with a cold snare. Resected and retrieved.                           -  One 5 mm polyp in the transverse colon, removed                            with a cold snare. Resected and retrieved.                           - One 15 mm polyp in the transverse colon, removed                            with a cold snare. Resected and retrieved. Clip was                            placed.                           - One 4 mm polyp in the sigmoid colon, removed with                            a cold snare. Resected and retrieved.                           - The distal rectum and anal verge are normal on                            retroflexion view. Recommendation:           - Patient has a contact number available for                            emergencies. The signs and symptoms of potential                            delayed complications were discussed with the                            patient. Return to normal activities tomorrow.                            Written discharge instructions were provided to the                            patient.                           - Resume previous diet.                           - Continue present medications.                           - No ibuprofen, naproxen, or other non-steroidal                            anti-inflammatory drugs for 1 week after polyp  removal.                           - Await pathology results.                           - Repeat colonoscopy is recommended for                            surveillance. The colonoscopy date will be                             determined after pathology results from today's                            exam become available for review. Jerene Bears, MD 06/03/2018 9:52:08 AM This report has been signed electronically.

## 2018-06-03 NOTE — Patient Instructions (Addendum)
YOU HAD AN ENDOSCOPIC PROCEDURE TODAY AT [facility]:   Refer to the procedure report that was given to you for any specific questions about what was found during the examination.  If the procedure report does not answer your questions, please call your gastroenterologist to clarify.  If you requested that your care partner not be given the details of your procedure findings, then the procedure report has been included in a sealed envelope for you to review at your convenience later.  YOU SHOULD EXPECT: Some feelings of bloating in the abdomen. Passage of more gas than usual.  Walking can help get rid of the air that was put into your GI tract during the procedure and reduce the bloating. If you had a lower endoscopy (such as a colonoscopy or flexible sigmoidoscopy) you may notice spotting of blood in your stool or on the toilet paper. If you underwent a bowel prep for your procedure, you may not have a normal bowel movement for a few days.  Please Note:  You might notice some irritation and congestion in your nose or some drainage.  This is from the oxygen used during your procedure.  There is no need for concern and it should clear up in a day or so.  SYMPTOMS TO REPORT IMMEDIATELY:   Following lower endoscopy (colonoscopy or flexible sigmoidoscopy):  Excessive amounts of blood in the stool  Significant tenderness or worsening of abdominal pains  Swelling of the abdomen that is new, acute  Fever of 100F or higher   For urgent or emergent issues, a gastroenterologist can be reached at any hour by calling [phone].   DIET:  We do recommend a small meal at first, but then you may proceed to your regular diet.  Drink plenty of fluids but you should avoid alcoholic beverages for 24 hours.  ACTIVITY:  You should plan to take it easy for the rest of today and you should NOT DRIVE or use heavy machinery until tomorrow (because of the sedation medicines used during the test).     FOLLOW UP: Our staff will call the number listed on your records the next business day following your procedure to check on you and address any questions or concerns that you may have regarding the information given to you following your procedure. If we do not reach you, we will leave a message.  However, if you are feeling well and you are not experiencing any problems, there is no need to return our call.  We will assume that you have returned to your regular daily activities without incident.  If any biopsies were taken you will be contacted by phone or by letter within the next 1-3 weeks.  Please call us at [phone] if you have not heard about the biopsies in 3 weeks.    SIGNATURES/CONFIDENTIALITY: You and/or your care partner have signed paperwork which will be entered into your electronic medical record.  These signatures attest to the fact that that the information above on your After Visit Summary has been reviewed and is understood.  Full responsibility of the confidentiality of this discharge information lies with you and/or your care-partner.    Handout was given to your care partner on polyps. NO ASPIRIN, ASPIRIN CONTAINING PRODUCTS (BC OR GOODY POWDERS) OR NSAIDS (IBUPROFEN, ADVIL, ALEVE, AND MOTRIN) FOR 1 week; TYLENOL IS OK TO TAKE.  Also Per Dr. Hilarie Fredrickson pt may take his aspirin 81 mg daily. You may resume your other current medications  today. Await biopsy results. Please call if any questions or concerns.

## 2018-06-03 NOTE — Progress Notes (Signed)
Called to room to assist during endoscopic procedure.  Patient ID and intended procedure confirmed with present staff. Received instructions for my participation in the procedure from the performing physician.  

## 2018-06-03 NOTE — Progress Notes (Signed)
Patient's chart documented a recording of tachy arrthymia. Patient stating he was in [place] bird hunting and was asymptomatic. No changes in medical,surgical,family or social hx.

## 2018-06-03 NOTE — Progress Notes (Signed)
PT taken to PACU. Monitors in place. VSS. Report given to RN. 

## 2018-06-03 NOTE — Progress Notes (Signed)
No problems noted in the recovery room. maw 

## 2018-06-04 ENCOUNTER — Telehealth: Payer: Self-pay

## 2018-06-04 NOTE — Telephone Encounter (Signed)
  Follow up Call-  Call back number 06/03/2018  Post procedure Call Back phone  # [phone]  Permission to leave phone message Yes  Some recent data might be hidden     Patient questions:  Do you have a fever, pain , or abdominal swelling? No. Pain Score  0 *  Have you tolerated food without any problems? Yes.    Have you been able to return to your normal activities? Yes.    Do you have any questions about your discharge instructions: Diet   No. Medications  No. Follow up visit  No.  Do you have questions or concerns about your Care? No.  Actions: * If pain score is 4 or above: No action needed, pain <4.

## 2018-06-07 ENCOUNTER — Ambulatory Visit (INDEPENDENT_AMBULATORY_CARE_PROVIDER_SITE_OTHER): Payer: PPO | Admitting: *Deleted

## 2018-06-07 DIAGNOSIS — R55 Syncope and collapse: Secondary | ICD-10-CM

## 2018-06-09 NOTE — Progress Notes (Signed)
Carelink Summary Report / Loop Recorder 

## 2018-06-10 ENCOUNTER — Encounter: Payer: Self-pay | Admitting: Internal Medicine

## 2018-06-11 DIAGNOSIS — H2513 Age-related nuclear cataract, bilateral: Secondary | ICD-10-CM | POA: Diagnosis not present

## 2018-06-11 DIAGNOSIS — H04123 Dry eye syndrome of bilateral lacrimal glands: Secondary | ICD-10-CM | POA: Diagnosis not present

## 2018-06-17 ENCOUNTER — Telehealth: Payer: Self-pay | Admitting: *Deleted

## 2018-06-17 NOTE — Telephone Encounter (Signed)
LMOVM (DPR) requesting call back from patient regarding any symptoms during tachy episode on 06/16/18 at 1242. Gave direct DC phone number for return call.  ECG appears PAT (similar to tachy episode from 05/22/18), duration 20sec, median V rate 182bpm.

## 2018-06-18 NOTE — Telephone Encounter (Signed)
Spoke with patient regarding tachy episode. Patient states asymptomatic. He reports he was carrying a couch up the stairs around that time. Advised patient to call if patient experiences syncope or presyncope. Patient verbalized understanding. Patient inquired about symptom receiving another symptom activator. Advised patient I will place one at the front for him to pick up. Patient verbalized understanding. Will place episode in Dr. Sallyanne Kuster folder for review.

## 2018-06-28 LAB — CUP PACEART REMOTE DEVICE CHECK
Date Time Interrogation Session: 20191101213558
MDC IDC PG IMPLANT DT: 20190315

## 2018-07-10 ENCOUNTER — Ambulatory Visit (INDEPENDENT_AMBULATORY_CARE_PROVIDER_SITE_OTHER): Payer: PPO

## 2018-07-10 DIAGNOSIS — R55 Syncope and collapse: Secondary | ICD-10-CM | POA: Diagnosis not present

## 2018-07-11 NOTE — Progress Notes (Signed)
Carelink Summary Report / Loop Recorder 

## 2018-07-17 DIAGNOSIS — Z125 Encounter for screening for malignant neoplasm of prostate: Secondary | ICD-10-CM | POA: Diagnosis not present

## 2018-07-17 DIAGNOSIS — R82998 Other abnormal findings in urine: Secondary | ICD-10-CM | POA: Diagnosis not present

## 2018-07-17 DIAGNOSIS — R7301 Impaired fasting glucose: Secondary | ICD-10-CM | POA: Diagnosis not present

## 2018-07-17 DIAGNOSIS — E7849 Other hyperlipidemia: Secondary | ICD-10-CM | POA: Diagnosis not present

## 2018-07-24 DIAGNOSIS — Z87898 Personal history of other specified conditions: Secondary | ICD-10-CM | POA: Diagnosis not present

## 2018-07-24 DIAGNOSIS — R972 Elevated prostate specific antigen [PSA]: Secondary | ICD-10-CM | POA: Diagnosis not present

## 2018-07-24 DIAGNOSIS — R030 Elevated blood-pressure reading, without diagnosis of hypertension: Secondary | ICD-10-CM | POA: Diagnosis not present

## 2018-07-24 DIAGNOSIS — Z8679 Personal history of other diseases of the circulatory system: Secondary | ICD-10-CM | POA: Diagnosis not present

## 2018-07-24 DIAGNOSIS — Z1389 Encounter for screening for other disorder: Secondary | ICD-10-CM | POA: Diagnosis not present

## 2018-07-24 DIAGNOSIS — Z6820 Body mass index (BMI) 20.0-20.9, adult: Secondary | ICD-10-CM | POA: Diagnosis not present

## 2018-07-24 DIAGNOSIS — Z86010 Personal history of colonic polyps: Secondary | ICD-10-CM | POA: Diagnosis not present

## 2018-07-24 DIAGNOSIS — Z0000 Encounter for general adult medical examination without abnormal findings: Secondary | ICD-10-CM | POA: Diagnosis not present

## 2018-07-24 DIAGNOSIS — K4090 Unilateral inguinal hernia, without obstruction or gangrene, not specified as recurrent: Secondary | ICD-10-CM | POA: Diagnosis not present

## 2018-07-24 DIAGNOSIS — Z125 Encounter for screening for malignant neoplasm of prostate: Secondary | ICD-10-CM | POA: Diagnosis not present

## 2018-07-24 DIAGNOSIS — E7849 Other hyperlipidemia: Secondary | ICD-10-CM | POA: Diagnosis not present

## 2018-07-24 DIAGNOSIS — R7301 Impaired fasting glucose: Secondary | ICD-10-CM | POA: Diagnosis not present

## 2018-07-25 ENCOUNTER — Telehealth: Payer: Self-pay

## 2018-07-25 NOTE — Telephone Encounter (Signed)
ECG placed in Dr. Langley Gauss folder for review.

## 2018-07-25 NOTE — Telephone Encounter (Signed)
Spoke with pt regarding his episode of tachycardia on 12/14 @11 :20am. He states he had no symptoms and was unaware of the episode. Pt aware we will send to Dr C for recommendation and will call back if there is to be any changes.

## 2018-08-06 ENCOUNTER — Other Ambulatory Visit: Payer: Self-pay | Admitting: Cardiovascular Disease

## 2018-08-12 ENCOUNTER — Ambulatory Visit (INDEPENDENT_AMBULATORY_CARE_PROVIDER_SITE_OTHER): Payer: PPO

## 2018-08-12 DIAGNOSIS — R55 Syncope and collapse: Secondary | ICD-10-CM | POA: Diagnosis not present

## 2018-08-13 LAB — CUP PACEART REMOTE DEVICE CHECK
Implantable Pulse Generator Implant Date: 20190315
MDC IDC SESS DTM: 20200106223828

## 2018-08-13 NOTE — Progress Notes (Signed)
Carelink Summary Report / Loop Recorder 

## 2018-08-14 LAB — CUP PACEART REMOTE DEVICE CHECK
Date Time Interrogation Session: 20191204213643
MDC IDC PG IMPLANT DT: 20190315

## 2018-09-16 ENCOUNTER — Ambulatory Visit (INDEPENDENT_AMBULATORY_CARE_PROVIDER_SITE_OTHER): Payer: PPO

## 2018-09-16 DIAGNOSIS — I495 Sick sinus syndrome: Secondary | ICD-10-CM

## 2018-09-16 DIAGNOSIS — R55 Syncope and collapse: Secondary | ICD-10-CM | POA: Diagnosis not present

## 2018-09-17 LAB — CUP PACEART REMOTE DEVICE CHECK
Date Time Interrogation Session: 20200208224031
Implantable Pulse Generator Implant Date: 20190315

## 2018-09-25 DIAGNOSIS — R972 Elevated prostate specific antigen [PSA]: Secondary | ICD-10-CM | POA: Diagnosis not present

## 2018-09-27 NOTE — Progress Notes (Addendum)
Carelink Summary Report / Loop Recorder 

## 2018-10-17 ENCOUNTER — Ambulatory Visit (INDEPENDENT_AMBULATORY_CARE_PROVIDER_SITE_OTHER): Payer: PPO | Admitting: *Deleted

## 2018-10-17 DIAGNOSIS — R55 Syncope and collapse: Secondary | ICD-10-CM

## 2018-10-19 LAB — CUP PACEART REMOTE DEVICE CHECK
Date Time Interrogation Session: 20200312223701
Implantable Pulse Generator Implant Date: 20190315

## 2018-10-24 NOTE — Progress Notes (Signed)
Carelink Summary Report / Loop Recorder 

## 2018-10-25 DIAGNOSIS — C61 Malignant neoplasm of prostate: Secondary | ICD-10-CM | POA: Diagnosis not present

## 2018-10-25 DIAGNOSIS — R972 Elevated prostate specific antigen [PSA]: Secondary | ICD-10-CM | POA: Diagnosis not present

## 2018-10-31 DIAGNOSIS — C61 Malignant neoplasm of prostate: Secondary | ICD-10-CM | POA: Diagnosis not present

## 2018-11-12 DIAGNOSIS — L570 Actinic keratosis: Secondary | ICD-10-CM | POA: Diagnosis not present

## 2018-11-12 DIAGNOSIS — D225 Melanocytic nevi of trunk: Secondary | ICD-10-CM | POA: Diagnosis not present

## 2018-11-12 DIAGNOSIS — Z85828 Personal history of other malignant neoplasm of skin: Secondary | ICD-10-CM | POA: Diagnosis not present

## 2018-11-12 DIAGNOSIS — D2272 Melanocytic nevi of left lower limb, including hip: Secondary | ICD-10-CM | POA: Diagnosis not present

## 2018-11-12 DIAGNOSIS — L821 Other seborrheic keratosis: Secondary | ICD-10-CM | POA: Diagnosis not present

## 2018-11-12 DIAGNOSIS — D2262 Melanocytic nevi of left upper limb, including shoulder: Secondary | ICD-10-CM | POA: Diagnosis not present

## 2018-11-12 DIAGNOSIS — Z01812 Encounter for preprocedural laboratory examination: Secondary | ICD-10-CM | POA: Diagnosis not present

## 2018-11-12 DIAGNOSIS — L578 Other skin changes due to chronic exposure to nonionizing radiation: Secondary | ICD-10-CM | POA: Diagnosis not present

## 2018-11-12 DIAGNOSIS — D1801 Hemangioma of skin and subcutaneous tissue: Secondary | ICD-10-CM | POA: Diagnosis not present

## 2018-11-12 DIAGNOSIS — Z20828 Contact with and (suspected) exposure to other viral communicable diseases: Secondary | ICD-10-CM | POA: Diagnosis not present

## 2018-11-19 ENCOUNTER — Other Ambulatory Visit: Payer: Self-pay

## 2018-11-19 ENCOUNTER — Ambulatory Visit (INDEPENDENT_AMBULATORY_CARE_PROVIDER_SITE_OTHER): Payer: PPO | Admitting: *Deleted

## 2018-11-19 DIAGNOSIS — I495 Sick sinus syndrome: Secondary | ICD-10-CM

## 2018-11-19 LAB — CUP PACEART REMOTE DEVICE CHECK
Date Time Interrogation Session: 20200414215226
Implantable Pulse Generator Implant Date: 20190315

## 2018-11-26 NOTE — Progress Notes (Signed)
Carelink Summary Report / Loop Recorder 

## 2018-12-23 ENCOUNTER — Ambulatory Visit (INDEPENDENT_AMBULATORY_CARE_PROVIDER_SITE_OTHER): Payer: PPO | Admitting: *Deleted

## 2018-12-23 ENCOUNTER — Other Ambulatory Visit: Payer: Self-pay

## 2018-12-23 DIAGNOSIS — I495 Sick sinus syndrome: Secondary | ICD-10-CM

## 2018-12-23 LAB — CUP PACEART REMOTE DEVICE CHECK
Date Time Interrogation Session: 20200518000953
Implantable Pulse Generator Implant Date: 20190315

## 2019-01-01 NOTE — Progress Notes (Signed)
Carelink Summary Report / Loop Recorder 

## 2019-01-24 ENCOUNTER — Ambulatory Visit (INDEPENDENT_AMBULATORY_CARE_PROVIDER_SITE_OTHER): Payer: PPO | Admitting: *Deleted

## 2019-01-24 DIAGNOSIS — R55 Syncope and collapse: Secondary | ICD-10-CM

## 2019-01-25 LAB — CUP PACEART REMOTE DEVICE CHECK
Date Time Interrogation Session: 20200620001112
Implantable Pulse Generator Implant Date: 20190315

## 2019-01-29 NOTE — Progress Notes (Signed)
Carelink Summary Report / Loop Recorder 

## 2019-02-10 ENCOUNTER — Telehealth: Payer: Self-pay | Admitting: *Deleted

## 2019-02-10 NOTE — Telephone Encounter (Signed)
Left a message for the patient to call back to see if he would like to do a virtual visit or office visit tomorrow with Dr. Sallyanne Kuster. If virtual, he will need consent.

## 2019-02-10 NOTE — Telephone Encounter (Signed)
The patient has been advised that no visitors are allowed at his appointment tomorrow and that he must wear a mask.      COVID-19 Pre-Screening Questions:  . In the past 7 to 10 days have you had a cough,  shortness of breath, headache, congestion, fever (100 or greater) body aches, chills, sore throat, or sudden loss of taste or sense of smell? No . Have you been around anyone with known Covid 19. No . Have you been around anyone who is awaiting Covid 19 test results in the past 7 to 10 days? No . Have you been around anyone who has been exposed to Covid 19, or has mentioned symptoms of Covid 19 within the past 7 to 10 days? No  If you have any concerns/questions about symptoms patients report during screening (either on the phone or at threshold). Contact the provider seeing the patient or DOD for further guidance.  If neither are available contact a member of the leadership team.

## 2019-02-11 ENCOUNTER — Ambulatory Visit: Payer: PPO | Admitting: Cardiovascular Disease

## 2019-02-11 ENCOUNTER — Encounter: Payer: Self-pay | Admitting: Cardiovascular Disease

## 2019-02-11 ENCOUNTER — Other Ambulatory Visit: Payer: Self-pay

## 2019-02-11 VITALS — BP 150/91 | HR 95 | Temp 98.1°F | Ht 67.0 in | Wt 132.0 lb

## 2019-02-11 DIAGNOSIS — Z95818 Presence of other cardiac implants and grafts: Secondary | ICD-10-CM | POA: Diagnosis not present

## 2019-02-11 DIAGNOSIS — R55 Syncope and collapse: Secondary | ICD-10-CM

## 2019-02-11 DIAGNOSIS — E7800 Pure hypercholesterolemia, unspecified: Secondary | ICD-10-CM

## 2019-02-11 DIAGNOSIS — R030 Elevated blood-pressure reading, without diagnosis of hypertension: Secondary | ICD-10-CM

## 2019-02-11 DIAGNOSIS — I340 Nonrheumatic mitral (valve) insufficiency: Secondary | ICD-10-CM

## 2019-02-11 NOTE — Progress Notes (Signed)
Cardiology office note:    Date:  02/13/2019   ID:  Tyler Waller, DOB Jun 16, 1946, MRN [id number]  PCP:  Marton Redwood, MD  Cardiologist:  Alazia Crocket  Referring MD: Marton Redwood, MD   Chief Complaint  Patient presents with  . Loss of Consciousness    Implantable loop recorder   Syncope/ILR   History of Present Illness:    Tyler Waller is a 73 y.o. male with a hx of an episode of syncope ,s/p ILR implantation in March 2019.  He has a physical exam strongly suggestive of mitral valve prolapse with mitral regurgitation.  He has not had any syncope or presyncope since the loop recorder was implanted.  He is in excellent physical shape and is very active.  The patient specifically denies any chest pain at rest exertion, dyspnea at rest or with exertion, orthopnea, paroxysmal nocturnal dyspnea, syncope, palpitations, focal neurological deficits, intermittent claudication, lower extremity edema, unexplained weight gain, cough, hemoptysis or wheezing.  Activity level has been steady and he has a good difference between day and night average heart rates, but otherwise the loop recorder has not provided any diagnostic information.  No arrhythmia has been detected.  Tyler Waller and his wife were taking care of their grandchild when she noticed that he developed rather slow and slurred speech.  He did not have any other lateralizing or focal neurological signs.  As they were walking, his wife noticed that he stumbled across the steps.  When he entered the house, he lost consciousness and collapsed to the floor and hit his head.  He was picked up by EMS and brought to the emergency room.  He did not fully recover from confusion for about 2 hours.  He did not have loss of sphincter tone did not have any convulsions.  He has no residual sequelae from this event.  He is also completely amnestic for the events.  He does not remember if he had any prodromal symptoms.  While at [facility] and [place]  he reportedly underwent both CT and MRI of the head which were normal and an echocardiogram with bubble study which was also reportedly normal.  He had no rhythm abnormalities on telemetry.  He had minimal rhythm abnormalities on the subsequent 30-day event monitor, isolated PVCs.  He had another syncopal event about 20 years ago.  He remembers walking towards the bathroom and falling backwards against his bed, losing consciousness very briefly if at all.  He reports a poorly defined history of "seizures" when he was only 73 years old.  He does not know any details about this.  He has relatively mild hypercholesterolemia at 219, but with an outstanding HDL cholesterol 127 and LDL of only 84 he does not have hypertension, diabetes mellitus history of smoking or family history of premature coronary artery disease.  His sister did receive a pacemaker around age of 61.  He has a brother with a known abdominal aortic aneurysm.  His father died of lung cancer in his 50s and his mother died of lymphoma in her late 32s.  He denies knowing that he has any heart problems, but an echocardiogram performed in 2008 showed mitral valve prolapse with trivial mitral regurgitation.  On exam today he actually has a significant holosystolic murmur at the apex, sounds more than trivial.  I do not have the actual echo report from [place].    His electrocardiogram is completely normal.  This shows sinus rhythm.  The QT interval is  417 ms.  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  Therefore he has a reasonable understanding of medical terminology and pathology.   Past Medical History:  Diagnosis Date  . Cancer ([place])    skin cancer   . GERD (gastroesophageal reflux disease)   . Hernia, inguinal   . Hyperlipidemia     Past Surgical History:  Procedure Laterality Date  . COLONOSCOPY  2009,  2006, 2002, 2001   Dr. Sharlett Iles; hx tubular adenomas  . 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 LOOP RECORDER INSERTION N/A 10/19/2017   Procedure: LOOP RECORDER INSERTION;  Surgeon: Sanda Klein, MD;  Location: [facility];  Service: Cardiovascular;  Laterality: N/A;  . TONSILLECTOMY  1968    Current Medications: Current Meds  Medication Sig  . aspirin EC 81 MG tablet Take 81 mg by mouth daily.  . Multiple Vitamins-Minerals (CENTRUM SILVER ULTRA MENS PO) Take 1 tablet by mouth every Monday, Wednesday, and Friday.  . Naphazoline-Pheniramine (OPCON-A) 0.027-0.315 % SOLN Place 2 drops into both eyes daily.  . rosuvastatin (CRESTOR) 5 MG tablet Take 5 mg by mouth daily.   . [DISCONTINUED] acetaminophen (TYLENOL) 500 MG tablet Take 500 mg by mouth every 6 (six) hours as needed.  . [DISCONTINUED] ibuprofen (ADVIL,MOTRIN) 200 MG tablet Take 400 mg by mouth every 6 (six) hours as needed for headache or moderate pain.  . [DISCONTINUED] naproxen sodium (ALEVE) 220 MG tablet Take 220 mg by mouth 2 (two) times daily as needed.     Allergies:   Patient has no known allergies.   Social History   Socioeconomic History  . Marital status: Married    Spouse name: Not on file  . Number of children: Not on file  . Years of education: Not on file  . Highest education level: Not on file  Occupational History  . Not on file  Social Needs  . Financial resource strain: Not on file  . Food insecurity    Worry: Not on file    Inability: Not on file  . Transportation needs    Medical: Not on file    Non-medical: Not on file  Tobacco Use  . Smoking status: Former Smoker    Quit date: 07/18/1967    Years since quitting: 51.6  . Smokeless tobacco: Never Used  Substance and Sexual Activity  . Alcohol use: No  . Drug use: No  . Sexual activity: Not on file  Lifestyle  . Physical activity    Days per week: Not on file    Minutes per session: Not on file  . Stress: Not  on file  Relationships  . Social Herbalist on phone: Not on file    Gets together: Not on file    Attends religious service: Not on file    Active member of club or organization: Not on file    Attends meetings of clubs or organizations: Not on file    Relationship status: Not on file  Other Topics Concern  . Not on file  Social History Narrative  . Not on file     Family History: The patient's family history includes AAA (abdominal aortic aneurysm) in his brother; Healthy in his daughter and son; Lung cancer in his father; Lymphoma in his mother. There is no history of Colon cancer, Stomach cancer,  Esophageal cancer, Rectal cancer, or Colon polyps.  ROS:   Please see the history of present illness.     All other systems reviewed and are negative.  EKGs/Labs/Other Studies Reviewed:    The following studies were reviewed today: Notes from Dr. Manuella Ghazi which referred to the studies performed in [place], but I do not have those actual records  EKG:  EKG is not ordered today.    Recent Labs: July 17, 2018 hemoglobin 14.8, creatinine 0.9, normal liver function tests, TSH 1.51, hemoglobin A1c 5.1% Recent Lipid Panel July 17, 2018 total cholesterol 208, HDL 116, LDL 86, triglycerides 32  Physical Exam:    VS:  BP (!) 150/91   Pulse 95   Temp 98.1 F (36.7 C)   Ht 5\' 7"  (1.702 m)   Wt 132 lb (59.9 kg)   SpO2 94%   BMI 20.67 kg/m     Wt Readings from Last 3 Encounters:  02/11/19 132 lb (59.9 kg)  06/03/18 129 lb (58.5 kg)  05/16/18 129 lb (58.5 kg)     General: Alert, oriented x3, no distress, he is very lean and very fit Head: no evidence of trauma, PERRL, EOMI, no exophtalmos or lid lag, no myxedema, no xanthelasma; normal ears, nose and oropharynx Neck: normal jugular venous pulsations and no hepatojugular reflux; brisk carotid pulses without delay and no carotid bruits Chest: clear to auscultation, no signs of consolidation by percussion or  palpation, normal fremitus, symmetrical and full respiratory excursions Cardiovascular: normal position and quality of the apical impulse, regular rhythm, normal first and second heart sounds.  He has a very distinct midsystolic click and a late systolic apical murmur radiating towards the axilla that varies in intensity with the Valsalva maneuver or handgrip, highly suggestive of mitral valve prolapse Abdomen: no tenderness or distention, no masses by palpation, no abnormal pulsatility or arterial bruits, normal bowel sounds, no hepatosplenomegaly Extremities: no clubbing, cyanosis or edema; 2+ radial, ulnar and brachial pulses bilaterally; 2+ right femoral, posterior tibial and dorsalis pedis pulses; 2+ left femoral, posterior tibial and dorsalis pedis pulses; no subclavian or femoral bruits Neurological: grossly nonfocal Psych: Normal mood and affect'   ASSESSMENT:    1. Hypercholesterolemia   2. Vasovagal syncope   3. Status post placement of implantable loop recorder   4. Non-rheumatic mitral regurgitation   5. Elevated BP without diagnosis of hypertension    PLAN:    In order of problems listed above:  1. Syncope: He has had 2 unusual syncopal events roughly 20 years apart.  No recurrent events since loop recorder implantation.. 2. ILR: No arrhythmia detected since implantation. 3. MR: His physical exam is highly suggestive of mitral regurgitation due to mitral valve prolapse.  The echo from [facility] in [place] showed "mild mitral regurgitation" without mention of prolapse or myxomatous changes.  The left ventricle was normal in size and function.  Further evaluation does not appear necessary since he is asymptomatic.  We will repeat an echocardiogram if he develops dyspnea.  Warned him about the potential for sudden worsening due to chordal rupture and/for endocarditis, although neither 1 of these is unlikely recurrence. 4. Hypercholesterolemia : I think his current LDL level  is quite satisfactory.  He has advanced age, male gender, family history of CAD and increased total cholesterol.  Normal treadmill stress test on October 16, 2017.  On statin.   Medication Adjustments/Labs and Tests Ordered: Current medicines are reviewed at length with the patient today.  Concerns regarding medicines  are outlined above.  Orders Placed This Encounter  Procedures  . EKG 12-Lead   No orders of the defined types were placed in this encounter.  Patient Instructions  Medication Instructions:  Your physician recommends that you continue on your current medications as directed. Please refer to the Current Medication list given to you today.  If you need a refill on your cardiac medications before your next appointment, please call your pharmacy.   Lab work: None ordered  Testing/Procedures: None ordered  Follow-Up: At [facility], you and your health needs are our priority.  As part of our continuing mission to provide you with exceptional heart care, we have created designated Provider Care Teams.  These Care Teams include your primary Cardiologist (physician) and Advanced Practice Providers (APPs -  Physician Assistants and Nurse Practitioners) who all work together to provide you with the care you need, when you need it. You will need a follow up appointment in 12 months.  Please call our office 2 months in advance to schedule this appointment.  You may see Sanda Klein, MD or one of the following Advanced Practice Providers on your designated Care Team: Almyra Deforest, PA-C Fabian Sharp, PA-C          Signed, Sanda Klein, MD  02/13/2019 5:49 PM    [place]

## 2019-02-11 NOTE — Patient Instructions (Signed)

## 2019-02-24 ENCOUNTER — Telehealth: Payer: Self-pay | Admitting: Student

## 2019-02-24 NOTE — Telephone Encounter (Signed)
  Reviewed episode with patient. He was riding around in the car with his wife, and didn't have any symptoms. Specifically denies lightheadedness, dizziness, near syncope, syncope, or palpitations/tachypalpitations.

## 2019-02-24 NOTE — Telephone Encounter (Signed)
It looks somewhat irregular at the beginning but then settles down into what appears to be ectopic atrial tachycardia.  Continue to monitor.  Thanks

## 2019-02-27 ENCOUNTER — Ambulatory Visit (INDEPENDENT_AMBULATORY_CARE_PROVIDER_SITE_OTHER): Payer: PPO | Admitting: *Deleted

## 2019-02-27 DIAGNOSIS — R55 Syncope and collapse: Secondary | ICD-10-CM | POA: Diagnosis not present

## 2019-02-27 LAB — CUP PACEART REMOTE DEVICE CHECK
Date Time Interrogation Session: 20200723003622
Implantable Pulse Generator Implant Date: 20190315

## 2019-03-11 NOTE — Progress Notes (Signed)
Carelink Summary Report / Loop Recorder 

## 2019-03-18 ENCOUNTER — Ambulatory Visit: Payer: PPO | Admitting: Cardiovascular Disease

## 2019-03-18 ENCOUNTER — Other Ambulatory Visit: Payer: Self-pay | Admitting: Urology

## 2019-03-18 DIAGNOSIS — C61 Malignant neoplasm of prostate: Secondary | ICD-10-CM

## 2019-03-31 ENCOUNTER — Ambulatory Visit (INDEPENDENT_AMBULATORY_CARE_PROVIDER_SITE_OTHER): Payer: PPO | Admitting: *Deleted

## 2019-03-31 DIAGNOSIS — R55 Syncope and collapse: Secondary | ICD-10-CM

## 2019-04-01 LAB — CUP PACEART REMOTE DEVICE CHECK
Date Time Interrogation Session: 20200825004055
Implantable Pulse Generator Implant Date: 20190315

## 2019-04-07 NOTE — Progress Notes (Signed)
Carelink Summary Report / Loop Recorder 

## 2019-04-19 DIAGNOSIS — Z23 Encounter for immunization: Secondary | ICD-10-CM | POA: Diagnosis not present

## 2019-05-05 ENCOUNTER — Other Ambulatory Visit: Payer: Self-pay

## 2019-05-05 ENCOUNTER — Ambulatory Visit (INDEPENDENT_AMBULATORY_CARE_PROVIDER_SITE_OTHER): Payer: PPO | Admitting: *Deleted

## 2019-05-05 ENCOUNTER — Ambulatory Visit
Admission: RE | Admit: 2019-05-05 | Discharge: 2019-05-05 | Disposition: A | Discharge status: Home / Self Care | Payer: PPO | Source: Ambulatory Visit | Attending: Urology | Admitting: Urology

## 2019-05-05 DIAGNOSIS — R55 Syncope and collapse: Secondary | ICD-10-CM

## 2019-05-05 DIAGNOSIS — C61 Malignant neoplasm of prostate: Secondary | ICD-10-CM

## 2019-05-05 DIAGNOSIS — N400 Enlarged prostate without lower urinary tract symptoms: Secondary | ICD-10-CM | POA: Diagnosis not present

## 2019-05-05 MED ORDER — GADOBENATE DIMEGLUMINE 529 MG/ML IV SOLN
12.0000 mL | Freq: Once | INTRAVENOUS | Status: AC | PRN
  Administered 2019-05-05: 13:00:00 12 mL via INTRAVENOUS

## 2019-05-06 DIAGNOSIS — C61 Malignant neoplasm of prostate: Secondary | ICD-10-CM | POA: Diagnosis not present

## 2019-05-06 LAB — CUP PACEART REMOTE DEVICE CHECK
Date Time Interrogation Session: 20200929073745
Implantable Pulse Generator Implant Date: 20190315

## 2019-05-08 DIAGNOSIS — C61 Malignant neoplasm of prostate: Secondary | ICD-10-CM | POA: Diagnosis not present

## 2019-05-14 NOTE — Progress Notes (Signed)
Carelink Summary Report / Loop Recorder 

## 2019-06-08 LAB — CUP PACEART REMOTE DEVICE CHECK
Date Time Interrogation Session: 20201101121142
Implantable Pulse Generator Implant Date: 20190315

## 2019-06-09 ENCOUNTER — Ambulatory Visit (INDEPENDENT_AMBULATORY_CARE_PROVIDER_SITE_OTHER): Payer: PPO | Admitting: *Deleted

## 2019-06-09 DIAGNOSIS — R55 Syncope and collapse: Secondary | ICD-10-CM

## 2019-06-13 DIAGNOSIS — C61 Malignant neoplasm of prostate: Secondary | ICD-10-CM | POA: Diagnosis not present

## 2019-07-02 NOTE — Progress Notes (Signed)
Carelink Summary Report / Loop Recorder 

## 2019-07-11 ENCOUNTER — Ambulatory Visit (INDEPENDENT_AMBULATORY_CARE_PROVIDER_SITE_OTHER): Payer: PPO | Admitting: *Deleted

## 2019-07-11 DIAGNOSIS — R55 Syncope and collapse: Secondary | ICD-10-CM

## 2019-07-13 LAB — CUP PACEART REMOTE DEVICE CHECK
Date Time Interrogation Session: 20201204095553
Implantable Pulse Generator Implant Date: 20190315

## 2019-07-28 DIAGNOSIS — Z125 Encounter for screening for malignant neoplasm of prostate: Secondary | ICD-10-CM | POA: Diagnosis not present

## 2019-07-28 DIAGNOSIS — Z20828 Contact with and (suspected) exposure to other viral communicable diseases: Secondary | ICD-10-CM | POA: Diagnosis not present

## 2019-07-28 DIAGNOSIS — E7849 Other hyperlipidemia: Secondary | ICD-10-CM | POA: Diagnosis not present

## 2019-07-28 DIAGNOSIS — R7301 Impaired fasting glucose: Secondary | ICD-10-CM | POA: Diagnosis not present

## 2019-08-12 DIAGNOSIS — E7849 Other hyperlipidemia: Secondary | ICD-10-CM | POA: Diagnosis not present

## 2019-08-12 DIAGNOSIS — R82998 Other abnormal findings in urine: Secondary | ICD-10-CM | POA: Diagnosis not present

## 2019-08-13 ENCOUNTER — Ambulatory Visit (INDEPENDENT_AMBULATORY_CARE_PROVIDER_SITE_OTHER): Payer: PPO | Admitting: *Deleted

## 2019-08-13 DIAGNOSIS — R55 Syncope and collapse: Secondary | ICD-10-CM | POA: Diagnosis not present

## 2019-08-14 LAB — CUP PACEART REMOTE DEVICE CHECK
Date Time Interrogation Session: 20210106100026
Implantable Pulse Generator Implant Date: 20190315

## 2019-08-15 DIAGNOSIS — N529 Male erectile dysfunction, unspecified: Secondary | ICD-10-CM | POA: Diagnosis not present

## 2019-08-15 DIAGNOSIS — Z0000 Encounter for general adult medical examination without abnormal findings: Secondary | ICD-10-CM | POA: Diagnosis not present

## 2019-08-15 DIAGNOSIS — R7301 Impaired fasting glucose: Secondary | ICD-10-CM | POA: Diagnosis not present

## 2019-08-15 DIAGNOSIS — E785 Hyperlipidemia, unspecified: Secondary | ICD-10-CM | POA: Diagnosis not present

## 2019-08-15 DIAGNOSIS — C61 Malignant neoplasm of prostate: Secondary | ICD-10-CM | POA: Diagnosis not present

## 2019-08-15 DIAGNOSIS — Z87898 Personal history of other specified conditions: Secondary | ICD-10-CM | POA: Diagnosis not present

## 2019-08-15 DIAGNOSIS — R030 Elevated blood-pressure reading, without diagnosis of hypertension: Secondary | ICD-10-CM | POA: Diagnosis not present

## 2019-08-15 DIAGNOSIS — Z86010 Personal history of colonic polyps: Secondary | ICD-10-CM | POA: Diagnosis not present

## 2019-08-27 DIAGNOSIS — H6982 Other specified disorders of Eustachian tube, left ear: Secondary | ICD-10-CM | POA: Diagnosis not present

## 2019-08-27 DIAGNOSIS — H9192 Unspecified hearing loss, left ear: Secondary | ICD-10-CM | POA: Diagnosis not present

## 2019-08-28 ENCOUNTER — Ambulatory Visit: Payer: PPO | Attending: Internal Medicine

## 2019-08-28 DIAGNOSIS — Z23 Encounter for immunization: Secondary | ICD-10-CM | POA: Insufficient documentation

## 2019-08-28 NOTE — Progress Notes (Signed)
   Covid-19 Vaccination Clinic  Name:  Tyler Waller    MRN: [id number] DOB: [birth year]  08/28/2019  Mr. Tyler Waller was observed post Covid-19 immunization for 15 minutes without incidence. He was provided with Vaccine Information Sheet and instruction to access the V-Safe system.   Mr. Tyler Waller was instructed to call 911 with any severe reactions post vaccine: Marland Kitchen Difficulty breathing  . Swelling of your face and throat  . A fast heartbeat  . A bad rash all over your body  . Dizziness and weakness    Immunizations Administered    Name Date Dose VIS Date Route   Pfizer COVID-19 Vaccine 08/28/2019 10:52 AM 0.3 mL 07/18/2019 Intramuscular   Manufacturer: [place]   Lot: [id number]   [place]: [id number]

## 2019-09-06 ENCOUNTER — Ambulatory Visit: Payer: PPO

## 2019-09-15 ENCOUNTER — Ambulatory Visit (INDEPENDENT_AMBULATORY_CARE_PROVIDER_SITE_OTHER): Payer: PPO | Admitting: *Deleted

## 2019-09-15 DIAGNOSIS — R55 Syncope and collapse: Secondary | ICD-10-CM

## 2019-09-15 LAB — CUP PACEART REMOTE DEVICE CHECK
Date Time Interrogation Session: 20210208000315
Implantable Pulse Generator Implant Date: 20190315

## 2019-09-16 NOTE — Progress Notes (Signed)
ILR Remote 

## 2019-09-17 ENCOUNTER — Ambulatory Visit: Payer: PPO | Attending: Internal Medicine

## 2019-09-17 DIAGNOSIS — Z23 Encounter for immunization: Secondary | ICD-10-CM

## 2019-09-17 NOTE — Progress Notes (Signed)
   Covid-19 Vaccination Clinic  Name:  Tyler Waller    MRN: [id number] DOB: 1946-06-18  09/17/2019  [name] was observed post Covid-19 immunization for 15 minutes without incidence. He was provided with Vaccine Information Sheet and instruction to access the V-Safe system.   [name] was instructed to call 911 with any severe reactions post vaccine: Marland Kitchen Difficulty breathing  . Swelling of your face and throat  . A fast heartbeat  . A bad rash all over your body  . Dizziness and weakness    Immunizations Administered    Name Date Dose VIS Date Route   Pfizer COVID-19 Vaccine 09/17/2019  3:41 PM 0.3 mL 07/18/2019 Intramuscular   Manufacturer: [employer]   Lot: [id number]   [place]: [id number]

## 2019-10-01 DIAGNOSIS — H903 Sensorineural hearing loss, bilateral: Secondary | ICD-10-CM | POA: Diagnosis not present

## 2019-10-01 DIAGNOSIS — H9312 Tinnitus, left ear: Secondary | ICD-10-CM | POA: Diagnosis not present

## 2019-10-01 DIAGNOSIS — H9042 Sensorineural hearing loss, unilateral, left ear, with unrestricted hearing on the contralateral side: Secondary | ICD-10-CM | POA: Diagnosis not present

## 2019-10-08 DIAGNOSIS — H04123 Dry eye syndrome of bilateral lacrimal glands: Secondary | ICD-10-CM | POA: Diagnosis not present

## 2019-10-08 DIAGNOSIS — H2513 Age-related nuclear cataract, bilateral: Secondary | ICD-10-CM | POA: Diagnosis not present

## 2019-10-08 DIAGNOSIS — H43811 Vitreous degeneration, right eye: Secondary | ICD-10-CM | POA: Diagnosis not present

## 2019-10-16 ENCOUNTER — Ambulatory Visit (INDEPENDENT_AMBULATORY_CARE_PROVIDER_SITE_OTHER): Payer: PPO | Admitting: *Deleted

## 2019-10-16 DIAGNOSIS — R55 Syncope and collapse: Secondary | ICD-10-CM | POA: Diagnosis not present

## 2019-10-16 LAB — CUP PACEART REMOTE DEVICE CHECK
Date Time Interrogation Session: 20210311002815
Implantable Pulse Generator Implant Date: 20190315

## 2019-10-17 NOTE — Progress Notes (Signed)
ILR Remote 

## 2019-11-17 ENCOUNTER — Ambulatory Visit (INDEPENDENT_AMBULATORY_CARE_PROVIDER_SITE_OTHER): Payer: PPO | Admitting: *Deleted

## 2019-11-17 DIAGNOSIS — R55 Syncope and collapse: Secondary | ICD-10-CM | POA: Diagnosis not present

## 2019-11-17 LAB — CUP PACEART REMOTE DEVICE CHECK
Date Time Interrogation Session: 20210411033644
Implantable Pulse Generator Implant Date: 20190315

## 2019-11-18 NOTE — Progress Notes (Signed)
ILR Remote 

## 2019-11-26 DIAGNOSIS — L821 Other seborrheic keratosis: Secondary | ICD-10-CM | POA: Diagnosis not present

## 2019-11-26 DIAGNOSIS — D1801 Hemangioma of skin and subcutaneous tissue: Secondary | ICD-10-CM | POA: Diagnosis not present

## 2019-11-26 DIAGNOSIS — Z85828 Personal history of other malignant neoplasm of skin: Secondary | ICD-10-CM | POA: Diagnosis not present

## 2019-11-26 DIAGNOSIS — L570 Actinic keratosis: Secondary | ICD-10-CM | POA: Diagnosis not present

## 2019-12-10 DIAGNOSIS — C61 Malignant neoplasm of prostate: Secondary | ICD-10-CM | POA: Diagnosis not present

## 2019-12-15 ENCOUNTER — Telehealth: Payer: Self-pay

## 2019-12-15 NOTE — Telephone Encounter (Signed)
No changes, continue to monitor.

## 2019-12-15 NOTE — Telephone Encounter (Signed)
Received [facility] alert for 9 seconds of SVT 170's on 12/13/19 at 0850 AM. Called patient to assess, patient denies any complaints that morning. Patient is not able to recall anything that could have caused his rate to increase such as activity. Not currently taking any medications for rate control. Will route to Dr. Sallyanne Kuster for any further recommendations.

## 2019-12-17 DIAGNOSIS — C61 Malignant neoplasm of prostate: Secondary | ICD-10-CM | POA: Diagnosis not present

## 2019-12-18 LAB — CUP PACEART REMOTE DEVICE CHECK
Date Time Interrogation Session: 20210512033508
Implantable Pulse Generator Implant Date: 20190315

## 2019-12-22 ENCOUNTER — Ambulatory Visit (INDEPENDENT_AMBULATORY_CARE_PROVIDER_SITE_OTHER): Payer: PPO | Admitting: *Deleted

## 2019-12-22 DIAGNOSIS — R55 Syncope and collapse: Secondary | ICD-10-CM

## 2019-12-23 NOTE — Progress Notes (Signed)
Carelink Summary Report / Loop Recorder 

## 2020-01-26 ENCOUNTER — Ambulatory Visit (INDEPENDENT_AMBULATORY_CARE_PROVIDER_SITE_OTHER): Payer: PPO | Admitting: *Deleted

## 2020-01-26 DIAGNOSIS — R55 Syncope and collapse: Secondary | ICD-10-CM | POA: Diagnosis not present

## 2020-01-26 LAB — CUP PACEART REMOTE DEVICE CHECK
Date Time Interrogation Session: 20210621004810
Implantable Pulse Generator Implant Date: 20190315

## 2020-01-27 NOTE — Progress Notes (Signed)
Carelink Summary Report / Loop Recorder 

## 2020-03-01 ENCOUNTER — Ambulatory Visit (INDEPENDENT_AMBULATORY_CARE_PROVIDER_SITE_OTHER): Payer: PPO | Admitting: *Deleted

## 2020-03-01 DIAGNOSIS — R55 Syncope and collapse: Secondary | ICD-10-CM | POA: Diagnosis not present

## 2020-03-02 LAB — CUP PACEART REMOTE DEVICE CHECK
Date Time Interrogation Session: 20210725233230
Implantable Pulse Generator Implant Date: 20190315

## 2020-03-03 NOTE — Progress Notes (Signed)
Carelink Summary Report / Loop Recorder 

## 2020-03-10 ENCOUNTER — Ambulatory Visit: Payer: PPO | Admitting: Cardiovascular Disease

## 2020-03-10 ENCOUNTER — Encounter: Payer: Self-pay | Admitting: Cardiovascular Disease

## 2020-03-10 ENCOUNTER — Other Ambulatory Visit: Payer: Self-pay

## 2020-03-10 VITALS — BP 135/79 | HR 74 | Ht 68.0 in | Wt 130.8 lb

## 2020-03-10 DIAGNOSIS — E7800 Pure hypercholesterolemia, unspecified: Secondary | ICD-10-CM

## 2020-03-10 DIAGNOSIS — I341 Nonrheumatic mitral (valve) prolapse: Secondary | ICD-10-CM

## 2020-03-10 DIAGNOSIS — Z95818 Presence of other cardiac implants and grafts: Secondary | ICD-10-CM

## 2020-03-10 DIAGNOSIS — Z87898 Personal history of other specified conditions: Secondary | ICD-10-CM

## 2020-03-10 NOTE — Progress Notes (Signed)
ekg 

## 2020-03-10 NOTE — Patient Instructions (Signed)

## 2020-03-10 NOTE — Progress Notes (Signed)
Cardiology office note:    Date:  03/10/2020   ID:  Tyler Waller, DOB [birth year], MRN [id number]  PCP:  Marton Redwood, MD  Cardiologist:  Dolton Shaker  Referring MD: Marton Redwood, MD   No chief complaint on file.    History of Present Illness:    Tyler Waller is a 74 y.o. male with a hx of an episode of syncope ,s/p ILR implantation in March 2019.  He has a physical exam strongly suggestive of mitral valve prolapse with mitral regurgitation.  She has mild hypercholesterolemia and upon treatment with statin, propafenone CAD or PAD.  He remains physically active and walks 1 to 1.25 hours every day at least 3 days a week.  He is very lean and fit.  He has not had any problems with palpitations, dizziness, syncope, chest pain, shortness of breath at rest or activity, orthopnea, PND, edema, focal neurological events or intermittent claudication.  His most recent hemoglobin A1c was 4.9% and the LDL cholesterol was 83 pressure control has been good with typical readings in the 110s/70s.  The loop recorder has not detected any meaningful arrhythmia.  She continues have a good separation between average daytime and nighttime heart rates. ECG today shows premature atrial contractions.  Tyler Waller and his wife were taking care of their grandchild when she noticed that he developed rather slow and slurred speech.  He did not have any other lateralizing or focal neurological signs.  As they were walking, his wife noticed that he stumbled across the steps.  When he entered the house, he lost consciousness and collapsed to the floor and hit his head.  He was picked up by EMS and brought to the emergency room.  He did not fully recover from confusion for about 2 hours.  He did not have loss of sphincter tone did not have any convulsions.  He has no residual sequelae from this event.  He is also completely amnestic for the events.  He does not remember if he had any prodromal symptoms.  While at [facility] and [place] he reportedly underwent both CT and MRI of the head which were normal and an echocardiogram with bubble study which was also reportedly normal.  He had no rhythm abnormalities on telemetry.  He had minimal rhythm abnormalities on the subsequent 30-day event monitor, isolated PVCs.  He had another syncopal event about 20 years ago.  He remembers walking towards the bathroom and falling backwards against his bed, losing consciousness very briefly if at all.  He reports a poorly defined history of "seizures" when he was only 74 years old.  He does not know any details about this.  He has relatively mild hypercholesterolemia at 219, but with an outstanding HDL cholesterol 127 and LDL of only 84 he does not have hypertension, diabetes mellitus history of smoking or family history of premature coronary artery disease.  His sister did receive a pacemaker around age of 19.  He has a brother with a known abdominal aortic aneurysm.  His father died of lung cancer in his 97s and his mother died of lymphoma in her late 49s.  He denies knowing that he has any heart problems, but an echocardiogram performed in 2008 showed mitral valve prolapse with trivial mitral regurgitation.  On exam today he actually has a significant holosystolic murmur at the apex, sounds more than trivial.  I do not have the actual echo report from [place].    His electrocardiogram is completely normal.  This shows sinus rhythm.  The QT interval is 417 ms.  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  Therefore he has a reasonable understanding of medical terminology and pathology.   Past Medical History:  Diagnosis Date   Cancer [facility])    skin cancer    GERD (gastroesophageal reflux disease)    Hernia, inguinal    Hyperlipidemia     Past Surgical History:  Procedure Laterality Date   COLONOSCOPY  2009, 2006, 2002,  2001   Dr. Sharlett Iles; hx tubular adenomas   HAND SURGERY  1999   right hand Dupreyen's contracture   LOOP RECORDER INSERTION N/A 10/19/2017   Procedure: LOOP RECORDER INSERTION;  Surgeon: Sanda Klein, MD;  Location: [place] [facility];  Service: Cardiovascular;  Laterality: N/A;   TONSILLECTOMY  1968    Current Medications: Current Meds  Medication Sig   aspirin EC 81 MG tablet Take 81 mg by mouth daily.   Multiple Vitamins-Minerals (CENTRUM SILVER ULTRA MENS PO) Take 1 tablet by mouth every Monday, Wednesday, and Friday.   Naphazoline-Pheniramine (OPCON-A) 0.027-0.315 % SOLN Place 2 drops into both eyes daily.   rosuvastatin (CRESTOR) 5 MG tablet Take 5 mg by mouth daily.      Allergies:   Patient has no known allergies.   Social History   Socioeconomic History   Marital status: Married    Spouse name: Not on file   Number of children: Not on file   Years of education: Not on file   Highest education level: Not on file  Occupational History   Not on file  Tobacco Use   Smoking status: Former Smoker    Quit date: 07/18/1967    Years since quitting: 52.6   Smokeless tobacco: Never Used  Vaping Use   Vaping Use: Never used  Substance and Sexual Activity   Alcohol use: No   Drug use: No   Sexual activity: Not on file  Other Topics Concern   Not on file  Social History Narrative   Not on file   Social Determinants of Health   Financial Resource Strain:    Difficulty of Paying Living Expenses:   Food Insecurity:    Worried About Charity fundraiser in the Last Year:    Arboriculturist in the Last Year:   Transportation Needs:    Film/video editor (Medical):    Lack of Transportation (Non-Medical):   Physical Activity:    Days of Exercise per Week:    Minutes of Exercise per Session:   Stress:    Feeling of Stress :   Social Connections:    Frequency of Communication with Friends and Family:    Frequency of Social  Gatherings with Friends and Family:    Attends Religious Services:    Active Member of Clubs or Organizations:    Attends Music therapist:    Marital Status:      Family History: The patient's family history includes AAA (abdominal aortic aneurysm) in his brother; Healthy in his daughter and son; Lung cancer in his father; Lymphoma in his mother. There is no history of Colon cancer, Stomach cancer, Esophageal cancer, Rectal cancer, or Colon polyps.  ROS:   Please see the history of present illness.   All other systems are reviewed and are negative.  EKGs/Labs/Other Studies Reviewed:    The following studies were reviewed today: Comprehensive loop recorder download  EKG:  EKG is ordered today.  It shows sinus rhythm with PACs but is otherwise normal.  QTc 412 seconds  Recent Labs: From Dr. Raul Del office hemoglobin 16.5, creatinine 0.9, hemoglobin A1c 4.9% Recent Lipid Panel Total cholesterol 208, HDL 116, LDL 83, triglycerides 45  Physical Exam:    VS:  BP 135/79    Pulse 74    Ht 5\' 8"  (1.727 m)    Wt 130 lb 12.8 oz (59.3 kg)    SpO2 98%    BMI 19.89 kg/m     Wt Readings from Last 3 Encounters:  03/10/20 130 lb 12.8 oz (59.3 kg)  02/11/19 132 lb (59.9 kg)  06/03/18 129 lb (58.5 kg)    General: Alert, oriented x3, no distress, appears lean, fit, younger than stated age.  Healthy loop recorder site Head: no evidence of trauma, PERRL, EOMI, no exophtalmos or lid lag, no myxedema, no xanthelasma; normal ears, nose and oropharynx Neck: normal jugular venous pulsations and no hepatojugular reflux; brisk carotid pulses without delay and no carotid bruits Chest: clear to auscultation, no signs of consolidation by percussion or palpation, normal fremitus, symmetrical and full respiratory excursions Cardiovascular: normal position and quality of the apical impulse, regular rhythm, normal first and second heart sounds, no diastolic murmurs, rubs or gallops.  He has a  very distinct midsystolic click and late systolic murmur that intensifies with a Valsalva maneuver and almost completely disappears with handgrip suggestive of mitral valve prolapse Abdomen: no tenderness or distention, no masses by palpation, no abnormal pulsatility or arterial bruits, normal bowel sounds, no hepatosplenomegaly Extremities: no clubbing, cyanosis or edema; 2+ radial, ulnar and brachial pulses bilaterally; 2+ right femoral, posterior tibial and dorsalis pedis pulses; 2+ left femoral, posterior tibial and dorsalis pedis pulses; no subclavian or femoral bruits Neurological: grossly nonfocal Psych: Normal mood and affect   ASSESSMENT:    No diagnosis found. PLAN:    In order of problems listed above:  1. Syncope: He has had 2 unusual syncopal events roughly 20 years apart.  No recurrence of events since loop recorder implantation in 2019. 2. ILR: No arrhythmia detected since implantation.  He would like the device removed as well as the battery reaches end of service. 3. 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  The echo from [facility] in [place] showed "mild mitral regurgitation" without mention of prolapse or myxomatous changes.  The left ventricle was normal in size and function.  Further evaluation does not appear necessary since he is asymptomatic.  We will repeat an echocardiogram if he develops dyspnea.  Reviewed again the possibility of chordal rupture and severe acute mitral regurgitation which would manifest primarily as sudden onset of dyspnea. 4. Hypercholesterolemia : He has advanced age, male gender, family history of CAD and increased total cholesterol.  Normal treadmill stress test on October 16, 2017.  On statin.  He has an outstanding HDL cholesterol level.  I think LDL less than 100 is satisfactory.   Medication Adjustments/Labs and Tests Ordered: Current medicines are reviewed at length with the patient today.  Concerns  regarding medicines are outlined above.  No orders of the defined types were placed in this encounter.  No orders of the defined types were placed in this encounter.  There are no Patient Instructions on file for this visit.   Signed, Sanda Klein, MD  03/10/2020 10:12 AM    [facility]

## 2020-03-12 ENCOUNTER — Encounter: Payer: Self-pay | Admitting: Cardiovascular Disease

## 2020-03-31 DIAGNOSIS — H903 Sensorineural hearing loss, bilateral: Secondary | ICD-10-CM | POA: Diagnosis not present

## 2020-04-05 ENCOUNTER — Ambulatory Visit (INDEPENDENT_AMBULATORY_CARE_PROVIDER_SITE_OTHER): Payer: PPO | Admitting: *Deleted

## 2020-04-05 DIAGNOSIS — R55 Syncope and collapse: Secondary | ICD-10-CM | POA: Diagnosis not present

## 2020-04-05 LAB — CUP PACEART REMOTE DEVICE CHECK
Date Time Interrogation Session: 20210825235120
Implantable Pulse Generator Implant Date: 20190315

## 2020-04-07 ENCOUNTER — Other Ambulatory Visit: Payer: Self-pay | Admitting: Otolaryngology

## 2020-04-07 DIAGNOSIS — H90A22 Sensorineural hearing loss, unilateral, left ear, with restricted hearing on the contralateral side: Secondary | ICD-10-CM

## 2020-04-07 DIAGNOSIS — H9312 Tinnitus, left ear: Secondary | ICD-10-CM | POA: Diagnosis not present

## 2020-04-07 NOTE — Progress Notes (Signed)
Carelink Summary Report / Loop Recorder 

## 2020-04-28 ENCOUNTER — Ambulatory Visit
Admission: RE | Admit: 2020-04-28 | Discharge: 2020-04-28 | Disposition: A | Discharge status: Home / Self Care | Payer: PPO | Source: Ambulatory Visit | Attending: Otolaryngology | Admitting: Otolaryngology

## 2020-04-28 ENCOUNTER — Other Ambulatory Visit: Payer: Self-pay

## 2020-04-28 DIAGNOSIS — H9312 Tinnitus, left ear: Secondary | ICD-10-CM

## 2020-04-28 DIAGNOSIS — H90A22 Sensorineural hearing loss, unilateral, left ear, with restricted hearing on the contralateral side: Secondary | ICD-10-CM

## 2020-04-28 DIAGNOSIS — R9082 White matter disease, unspecified: Secondary | ICD-10-CM | POA: Diagnosis not present

## 2020-04-28 DIAGNOSIS — H9042 Sensorineural hearing loss, unilateral, left ear, with unrestricted hearing on the contralateral side: Secondary | ICD-10-CM | POA: Diagnosis not present

## 2020-04-28 DIAGNOSIS — J3489 Other specified disorders of nose and nasal sinuses: Secondary | ICD-10-CM | POA: Diagnosis not present

## 2020-04-28 MED ORDER — GADOBENATE DIMEGLUMINE 529 MG/ML IV SOLN
15.0000 mL | Freq: Once | INTRAVENOUS | Status: AC | PRN
  Administered 2020-04-28: 15 mL via INTRAVENOUS

## 2020-05-10 ENCOUNTER — Ambulatory Visit (INDEPENDENT_AMBULATORY_CARE_PROVIDER_SITE_OTHER): Payer: PPO

## 2020-05-10 DIAGNOSIS — Z87898 Personal history of other specified conditions: Secondary | ICD-10-CM | POA: Diagnosis not present

## 2020-05-10 LAB — CUP PACEART REMOTE DEVICE CHECK
Date Time Interrogation Session: 20210925235001
Implantable Pulse Generator Implant Date: 20190315

## 2020-05-12 NOTE — Progress Notes (Signed)
Carelink Summary Report / Loop Recorder 

## 2020-05-24 DIAGNOSIS — H903 Sensorineural hearing loss, bilateral: Secondary | ICD-10-CM | POA: Diagnosis not present

## 2020-06-02 ENCOUNTER — Ambulatory Visit (INDEPENDENT_AMBULATORY_CARE_PROVIDER_SITE_OTHER): Payer: PPO

## 2020-06-02 DIAGNOSIS — Z87898 Personal history of other specified conditions: Secondary | ICD-10-CM | POA: Diagnosis not present

## 2020-06-02 LAB — CUP PACEART REMOTE DEVICE CHECK
Date Time Interrogation Session: 20211026235813
Implantable Pulse Generator Implant Date: 20190315

## 2020-06-07 NOTE — Progress Notes (Signed)
Carelink Summary Report / Loop Recorder 

## 2020-06-15 DIAGNOSIS — C61 Malignant neoplasm of prostate: Secondary | ICD-10-CM | POA: Diagnosis not present

## 2020-06-25 DIAGNOSIS — C61 Malignant neoplasm of prostate: Secondary | ICD-10-CM | POA: Diagnosis not present

## 2020-07-04 LAB — CUP PACEART REMOTE DEVICE CHECK
Date Time Interrogation Session: 20211126230035
Implantable Pulse Generator Implant Date: 20190315

## 2020-07-05 ENCOUNTER — Ambulatory Visit (INDEPENDENT_AMBULATORY_CARE_PROVIDER_SITE_OTHER): Payer: PPO

## 2020-07-05 DIAGNOSIS — Z87898 Personal history of other specified conditions: Secondary | ICD-10-CM

## 2020-07-12 NOTE — Progress Notes (Signed)
Carelink Summary Report / Loop Recorder 

## 2020-08-04 LAB — CUP PACEART REMOTE DEVICE CHECK
Date Time Interrogation Session: 20211227230731
Implantable Pulse Generator Implant Date: 20190315

## 2020-08-09 ENCOUNTER — Ambulatory Visit (INDEPENDENT_AMBULATORY_CARE_PROVIDER_SITE_OTHER): Payer: PPO

## 2020-08-09 DIAGNOSIS — I495 Sick sinus syndrome: Secondary | ICD-10-CM

## 2020-08-23 ENCOUNTER — Telehealth: Payer: Self-pay | Admitting: Student

## 2020-08-23 NOTE — Telephone Encounter (Signed)
  Pt states he was awake and duck hunting. He had been up since 0300 that am, and was wearing a thick hunting shirt that has a pocket that is closed by magnets right over the device. He thinks this may have interfered with the device.   He denies any symptoms of lightheadedness, dizziness, or rapid heart rates. He is feeling "great".   Tyler Waller [address] [place], PA-C  08/23/2020 10:14 AM

## 2020-08-23 NOTE — Telephone Encounter (Signed)
Agree, it wasn't the magnet or the ducks. True SVT. No change in therapy since the episode was brief and asymptomatic. Thanks.

## 2020-08-24 NOTE — Progress Notes (Signed)
Carelink Summary Report / Loop Recorder 

## 2020-08-25 DIAGNOSIS — E785 Hyperlipidemia, unspecified: Secondary | ICD-10-CM | POA: Diagnosis not present

## 2020-08-25 DIAGNOSIS — Z125 Encounter for screening for malignant neoplasm of prostate: Secondary | ICD-10-CM | POA: Diagnosis not present

## 2020-08-25 DIAGNOSIS — R7301 Impaired fasting glucose: Secondary | ICD-10-CM | POA: Diagnosis not present

## 2020-08-27 DIAGNOSIS — R7301 Impaired fasting glucose: Secondary | ICD-10-CM | POA: Diagnosis not present

## 2020-08-27 DIAGNOSIS — Z1331 Encounter for screening for depression: Secondary | ICD-10-CM | POA: Diagnosis not present

## 2020-08-27 DIAGNOSIS — D692 Other nonthrombocytopenic purpura: Secondary | ICD-10-CM | POA: Diagnosis not present

## 2020-08-27 DIAGNOSIS — R82998 Other abnormal findings in urine: Secondary | ICD-10-CM | POA: Diagnosis not present

## 2020-08-27 DIAGNOSIS — Z86010 Personal history of colonic polyps: Secondary | ICD-10-CM | POA: Diagnosis not present

## 2020-08-27 DIAGNOSIS — Z0000 Encounter for general adult medical examination without abnormal findings: Secondary | ICD-10-CM | POA: Diagnosis not present

## 2020-08-27 DIAGNOSIS — R030 Elevated blood-pressure reading, without diagnosis of hypertension: Secondary | ICD-10-CM | POA: Diagnosis not present

## 2020-08-27 DIAGNOSIS — C61 Malignant neoplasm of prostate: Secondary | ICD-10-CM | POA: Diagnosis not present

## 2020-08-27 DIAGNOSIS — H9192 Unspecified hearing loss, left ear: Secondary | ICD-10-CM | POA: Diagnosis not present

## 2020-08-27 DIAGNOSIS — N529 Male erectile dysfunction, unspecified: Secondary | ICD-10-CM | POA: Diagnosis not present

## 2020-08-27 DIAGNOSIS — I340 Nonrheumatic mitral (valve) insufficiency: Secondary | ICD-10-CM | POA: Diagnosis not present

## 2020-08-27 DIAGNOSIS — Z87898 Personal history of other specified conditions: Secondary | ICD-10-CM | POA: Diagnosis not present

## 2020-08-27 DIAGNOSIS — E785 Hyperlipidemia, unspecified: Secondary | ICD-10-CM | POA: Diagnosis not present

## 2020-09-03 ENCOUNTER — Ambulatory Visit (INDEPENDENT_AMBULATORY_CARE_PROVIDER_SITE_OTHER): Payer: PPO

## 2020-09-03 DIAGNOSIS — I495 Sick sinus syndrome: Secondary | ICD-10-CM

## 2020-09-03 LAB — CUP PACEART REMOTE DEVICE CHECK
Date Time Interrogation Session: 20220127231348
Implantable Pulse Generator Implant Date: 20190315

## 2020-09-11 NOTE — Progress Notes (Signed)
Carelink Summary Report / Loop Recorder 

## 2020-09-15 DIAGNOSIS — C61 Malignant neoplasm of prostate: Secondary | ICD-10-CM | POA: Diagnosis not present

## 2020-09-22 DIAGNOSIS — C61 Malignant neoplasm of prostate: Secondary | ICD-10-CM | POA: Diagnosis not present

## 2020-10-04 ENCOUNTER — Ambulatory Visit (INDEPENDENT_AMBULATORY_CARE_PROVIDER_SITE_OTHER): Payer: PPO

## 2020-10-04 DIAGNOSIS — I495 Sick sinus syndrome: Secondary | ICD-10-CM

## 2020-10-04 LAB — CUP PACEART REMOTE DEVICE CHECK
Date Time Interrogation Session: 20220228001515
Implantable Pulse Generator Implant Date: 20190315

## 2020-10-12 NOTE — Progress Notes (Signed)
Carelink Summary Report / Loop Recorder 

## 2020-10-13 DIAGNOSIS — H02831 Dermatochalasis of right upper eyelid: Secondary | ICD-10-CM | POA: Diagnosis not present

## 2020-10-13 DIAGNOSIS — H43811 Vitreous degeneration, right eye: Secondary | ICD-10-CM | POA: Diagnosis not present

## 2020-10-13 DIAGNOSIS — H2513 Age-related nuclear cataract, bilateral: Secondary | ICD-10-CM | POA: Diagnosis not present

## 2020-10-13 DIAGNOSIS — H02834 Dermatochalasis of left upper eyelid: Secondary | ICD-10-CM | POA: Diagnosis not present

## 2020-10-13 DIAGNOSIS — H04123 Dry eye syndrome of bilateral lacrimal glands: Secondary | ICD-10-CM | POA: Diagnosis not present

## 2020-11-04 ENCOUNTER — Ambulatory Visit (INDEPENDENT_AMBULATORY_CARE_PROVIDER_SITE_OTHER): Payer: PPO

## 2020-11-04 ENCOUNTER — Other Ambulatory Visit: Payer: Self-pay

## 2020-11-04 DIAGNOSIS — I495 Sick sinus syndrome: Secondary | ICD-10-CM | POA: Diagnosis not present

## 2020-11-04 LAB — CUP PACEART REMOTE DEVICE CHECK
Date Time Interrogation Session: 20220331011506
Implantable Pulse Generator Implant Date: 20190315

## 2020-11-17 NOTE — Progress Notes (Signed)
Carelink Summary Report / Loop Recorder 

## 2020-12-01 DIAGNOSIS — D225 Melanocytic nevi of trunk: Secondary | ICD-10-CM | POA: Diagnosis not present

## 2020-12-01 DIAGNOSIS — L720 Epidermal cyst: Secondary | ICD-10-CM | POA: Diagnosis not present

## 2020-12-01 DIAGNOSIS — L570 Actinic keratosis: Secondary | ICD-10-CM | POA: Diagnosis not present

## 2020-12-01 DIAGNOSIS — D692 Other nonthrombocytopenic purpura: Secondary | ICD-10-CM | POA: Diagnosis not present

## 2020-12-01 DIAGNOSIS — L821 Other seborrheic keratosis: Secondary | ICD-10-CM | POA: Diagnosis not present

## 2020-12-01 DIAGNOSIS — D1801 Hemangioma of skin and subcutaneous tissue: Secondary | ICD-10-CM | POA: Diagnosis not present

## 2020-12-01 DIAGNOSIS — L603 Nail dystrophy: Secondary | ICD-10-CM | POA: Diagnosis not present

## 2020-12-01 DIAGNOSIS — Z85828 Personal history of other malignant neoplasm of skin: Secondary | ICD-10-CM | POA: Diagnosis not present

## 2020-12-01 DIAGNOSIS — L814 Other melanin hyperpigmentation: Secondary | ICD-10-CM | POA: Diagnosis not present

## 2020-12-06 ENCOUNTER — Ambulatory Visit (INDEPENDENT_AMBULATORY_CARE_PROVIDER_SITE_OTHER): Payer: PPO

## 2020-12-06 DIAGNOSIS — I495 Sick sinus syndrome: Secondary | ICD-10-CM

## 2020-12-07 LAB — CUP PACEART REMOTE DEVICE CHECK
Date Time Interrogation Session: 20220501012017
Implantable Pulse Generator Implant Date: 20190315

## 2020-12-27 NOTE — Progress Notes (Signed)
Carelink Summary Report / Loop Recorder 

## 2021-01-05 LAB — CUP PACEART REMOTE DEVICE CHECK
Date Time Interrogation Session: 20220601011816
Implantable Pulse Generator Implant Date: 20190315

## 2021-01-06 ENCOUNTER — Ambulatory Visit (INDEPENDENT_AMBULATORY_CARE_PROVIDER_SITE_OTHER): Payer: PPO

## 2021-01-06 DIAGNOSIS — I495 Sick sinus syndrome: Secondary | ICD-10-CM | POA: Diagnosis not present

## 2021-01-31 NOTE — Progress Notes (Signed)
Carelink Summary Report / Loop Recorder 

## 2021-02-08 ENCOUNTER — Ambulatory Visit (INDEPENDENT_AMBULATORY_CARE_PROVIDER_SITE_OTHER): Payer: PPO

## 2021-02-08 DIAGNOSIS — I495 Sick sinus syndrome: Secondary | ICD-10-CM | POA: Diagnosis not present

## 2021-02-11 LAB — CUP PACEART REMOTE DEVICE CHECK
Date Time Interrogation Session: 20220707000500
Implantable Pulse Generator Implant Date: 20190315

## 2021-03-01 NOTE — Progress Notes (Signed)
Carelink Summary Report / Loop Recorder 

## 2021-03-09 LAB — CUP PACEART REMOTE DEVICE CHECK
Date Time Interrogation Session: 20220802012522
Implantable Pulse Generator Implant Date: 20190315

## 2021-03-14 ENCOUNTER — Ambulatory Visit (INDEPENDENT_AMBULATORY_CARE_PROVIDER_SITE_OTHER): Payer: PPO

## 2021-03-14 DIAGNOSIS — I495 Sick sinus syndrome: Secondary | ICD-10-CM | POA: Diagnosis not present

## 2021-04-07 NOTE — Progress Notes (Signed)
Carelink Summary Report / Loop Recorder 

## 2021-04-12 ENCOUNTER — Ambulatory Visit (INDEPENDENT_AMBULATORY_CARE_PROVIDER_SITE_OTHER): Payer: PPO

## 2021-04-12 DIAGNOSIS — I495 Sick sinus syndrome: Secondary | ICD-10-CM

## 2021-04-12 LAB — CUP PACEART REMOTE DEVICE CHECK
Date Time Interrogation Session: 20220902012822
Implantable Pulse Generator Implant Date: 20190315

## 2021-04-21 NOTE — Progress Notes (Signed)
Carelink Summary Report / Loop Recorder 

## 2021-04-27 DIAGNOSIS — C61 Malignant neoplasm of prostate: Secondary | ICD-10-CM | POA: Diagnosis not present

## 2021-05-04 DIAGNOSIS — C61 Malignant neoplasm of prostate: Secondary | ICD-10-CM | POA: Diagnosis not present

## 2021-05-12 LAB — CUP PACEART REMOTE DEVICE CHECK
Date Time Interrogation Session: 20221003012738
Implantable Pulse Generator Implant Date: 20190315

## 2021-05-16 ENCOUNTER — Ambulatory Visit (INDEPENDENT_AMBULATORY_CARE_PROVIDER_SITE_OTHER): Payer: PPO

## 2021-05-16 DIAGNOSIS — I495 Sick sinus syndrome: Secondary | ICD-10-CM

## 2021-05-25 NOTE — Progress Notes (Signed)
Carelink Summary Report / Loop Recorder 

## 2021-06-07 ENCOUNTER — Encounter: Payer: Self-pay | Admitting: Cardiovascular Disease

## 2021-06-07 ENCOUNTER — Other Ambulatory Visit: Payer: Self-pay

## 2021-06-07 ENCOUNTER — Ambulatory Visit: Payer: PPO | Admitting: Cardiovascular Disease

## 2021-06-07 VITALS — BP 134/78 | HR 103 | Ht 67.0 in | Wt 134.0 lb

## 2021-06-07 DIAGNOSIS — Z87898 Personal history of other specified conditions: Secondary | ICD-10-CM | POA: Diagnosis not present

## 2021-06-07 DIAGNOSIS — E7800 Pure hypercholesterolemia, unspecified: Secondary | ICD-10-CM

## 2021-06-07 DIAGNOSIS — Z95818 Presence of other cardiac implants and grafts: Secondary | ICD-10-CM | POA: Diagnosis not present

## 2021-06-07 DIAGNOSIS — I341 Nonrheumatic mitral (valve) prolapse: Secondary | ICD-10-CM | POA: Diagnosis not present

## 2021-06-07 NOTE — Patient Instructions (Signed)

## 2021-06-07 NOTE — Progress Notes (Signed)
Cardiology office note:    Date:  06/07/2021   ID:  Tyler Waller, DOB 03/26/1946, MRN [id number]  PCP:  Tyler Waller., MD  Cardiologist:  Tyler Waller  Referring MD: Tyler Waller., MD   Chief Complaint  Patient presents with   Follow-up    12 months.     History of Present Illness:    Tyler Waller is a 75 y.o. male with a hx of an episode of syncope, s/p ILR implantation in March 2019.  He has a physical exam strongly suggestive of mitral valve prolapse with mitral regurgitation.  He has mild hypercholesterolemia on statin, without known CAD/PAD.  Remains physically active and asymptomatic.  He walks about an hour to an hour and a half 3 to 4 days a week.  He has noticed that he becomes a little lightheaded if he bends over and holds his breath, for example when picking something off the floor.  It does not happen if he consciously continue his breathing regularly while he bends over.  He denies palpitations, syncope, chest pain, dyspnea at rest with activity, edema, claudication or focal neurological complaints.  His loop recorder downloads have been consistently normal.  His blood pressure is excellent.  Usually lower than it is today.  His most recent lipid profile showed an LDL of 85 but also an excellent HDL of 81.  Hemoglobin A1c was 5.0%.  ECG today shows mild sinus tachycardia, but he tells me he walked up the stairs to our office, 3 floors.  At home his usual heart rate is in the 90s.  His grandchildren in [place] are now ages 49 and 24 respectively.  They both play soccer and flag football.  Mr. Tyler Waller and his wife were taking care of their grandchild when she noticed that he developed rather slow and slurred speech.  He did not have any other lateralizing or focal neurological signs.  As they were walking, his wife noticed that he stumbled across the steps.  When he entered the house, he lost consciousness and collapsed to the floor and hit his  head.  He was picked up by EMS and brought to the emergency room.  He did not fully recover from confusion for about 2 hours.  He did not have loss of sphincter tone did not have any convulsions.  He has no residual sequelae from this event.  He is also completely amnestic for the events.  He does not remember if he had any prodromal symptoms.  While at [facility] and [place] he reportedly underwent both CT and MRI of the head which were normal and an echocardiogram with bubble study which was also reportedly normal.  He had no rhythm abnormalities on telemetry.  He had minimal rhythm abnormalities on the subsequent 30-day event monitor, isolated PVCs.  He had another syncopal event about 20 years ago.  He remembers walking towards the bathroom and falling backwards against his bed, losing consciousness very briefly if at all.  He reports a poorly defined history of "seizures" when he was only 75 years old.  He does not know any details about this.  He has relatively mild hypercholesterolemia at 219, but with an outstanding HDL cholesterol 127 and LDL of only 84 he does not have hypertension, diabetes mellitus history of smoking or family history of premature coronary artery disease.  His sister did receive a pacemaker around age of 9.  He has a brother with a known  abdominal aortic aneurysm.  His father died of lung cancer in his 69s and his mother died of lymphoma in her late 67s.  He denies knowing that he has any heart problems, but an echocardiogram performed in 2008 showed mitral valve prolapse with trivial mitral regurgitation.  On exam today he actually has a significant holosystolic murmur at the apex, sounds more than trivial.  I do not have the actual echo report from [place].    His electrocardiogram is completely normal.  This shows sinus rhythm.  The QT interval is 417 ms.  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  Therefore he has a reasonable understanding of medical terminology and pathology.   Past Medical History:  Diagnosis Date   Cancer [facility])    skin cancer    GERD (gastroesophageal reflux disease)    Hernia, inguinal    Hyperlipidemia     Past Surgical History:  Procedure Laterality Date   COLONOSCOPY  2009, 2006, 2002, 2001   Dr. Sharlett Iles; hx tubular adenomas   HAND SURGERY  1999   right hand Dupreyen's contracture   LOOP RECORDER INSERTION N/A 10/19/2017   Procedure: LOOP RECORDER INSERTION;  Surgeon: Sanda Klein, MD;  Location: [place] [facility];  Service: Cardiovascular;  Laterality: N/A;   TONSILLECTOMY  1968    Current Medications: Current Meds  Medication Sig   Multiple Vitamins-Minerals (CENTRUM SILVER ULTRA MENS PO) Take 1 tablet by mouth every Monday, Wednesday, and Friday.   Naphazoline-Pheniramine 0.027-0.315 % SOLN Place 2 drops into both eyes daily.   rosuvastatin (CRESTOR) 5 MG tablet Take 5 mg by mouth daily.      Allergies:   Patient has no known allergies.   Social History   Socioeconomic History   Marital status: Married    Spouse name: Not on file   Number of children: Not on file   Years of education: Not on file   Highest education level: Not on file  Occupational History   Not on file  Tobacco Use   Smoking status: Former    Types: Cigarettes    Quit date: 07/18/1967    Years since quitting: 53.9   Smokeless tobacco: Never  Vaping Use   Vaping Use: Never used  Substance and Sexual Activity   Alcohol use: No   Drug use: No   Sexual activity: Not on file  Other Topics Concern   Not on file  Social History Narrative   Not on file   Social Determinants of Health   Financial Resource Strain: Not on file  Food Insecurity: Not on file  Transportation Needs: Not on file  Physical Activity: Not on file  Stress: Not on file  Social Connections: Not on file     Family History: The patient's family history  includes AAA (abdominal aortic aneurysm) in his brother; Healthy in his daughter and son; Lung cancer in his father; Lymphoma in his mother. There is no history of Colon cancer, Stomach cancer, Esophageal cancer, Rectal cancer, or Colon polyps.  ROS:   Please see the history of present illness.   All other systems are reviewed and are negative.  EKGs/Labs/Other Studies Reviewed:    The following studies were reviewed today: most recent loop recorder download from earlier this month  EKG:  EKG is ordered today.  It shows mild sinus tachycardia otherwise completely normal  Recent Labs: From Dr. Raul Del office 08/25/2020 Hemoglobin A1c 5.0%,  potassium 4.8, ALT 28, TSH 1.51  Recent Lipid Panel From Dr. Raul Del office 08/25/2020 Total cholesterol 176, HDL 81, LDL 85, triglycerides 49  Physical Exam:    VS:  BP 134/78 (BP Location: Left Arm, Patient Position: Sitting, Cuff Size: Normal)   Pulse (!) 103   Ht 5\' 7"  (1.702 m)   Wt 134 lb (60.8 kg)   BMI 20.99 kg/m     Wt Readings from Last 3 Encounters:  06/07/21 134 lb (60.8 kg)  03/10/20 130 lb 12.8 oz (59.3 kg)  02/11/19 132 lb (59.9 kg)     General: Alert, oriented x3, no distress, appears very lean and fit Head: no evidence of trauma, PERRL, EOMI, no exophtalmos or lid lag, no myxedema, no xanthelasma; normal ears, nose and oropharynx Neck: normal jugular venous pulsations and no hepatojugular reflux; brisk carotid pulses without delay and no carotid bruits Chest: clear to auscultation, no signs of consolidation by percussion or palpation, normal fremitus, symmetrical and full respiratory excursions Cardiovascular: normal position and quality of the apical impulse, regular rhythm, normal first and second heart sounds, midsystolic click and musical 0-3/8 late systolic murmur are heard at the apex and intensify with a Valsalva maneuver, no diastolic murmurs, rubs or gallops Abdomen: no tenderness or distention, no masses by  palpation, no abnormal pulsatility or arterial bruits, normal bowel sounds, no hepatosplenomegaly Extremities: no clubbing, cyanosis or edema; 2+ radial, ulnar and brachial pulses bilaterally; 2+ right femoral, posterior tibial and dorsalis pedis pulses; 2+ left femoral, posterior tibial and dorsalis pedis pulses; no subclavian or femoral bruits Neurological: grossly nonfocal Psych: Normal mood and affect    ASSESSMENT:    1. History of syncope   2. Status post placement of implantable loop recorder   3. Mitral valve prolapse   4. Hypercholesterolemia    PLAN:    In order of problems listed above:  Syncope: No recent events.  He has had 2 unusual syncopal events roughly 20 years apart.  No syncope since loop recorder implantation in 2019 ILR: No arrhythmia detected since implantation.  He has told me that when the device reaches end of service she would like to have it removed. 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  We discussed possible complications including endocarditis and chordal rupture with sudden worsening of dyspnea.  The echo from [facility] in [place] showed "mild mitral regurgitation" without mention of prolapse or myxomatous changes.  The left ventricle was normal in size and function.  Further evaluation does not appear necessary since he is asymptomatic.  We will repeat an echocardiogram if he develops dyspnea.   Hypercholesterolemia : He has advanced age, male gender, family history of CAD and increased total cholesterol.  Normal treadmill stress test on October 16, 2017.  On statin.  I think an LDL less than 100 is a reasonable target for him.   Medication Adjustments/Labs and Tests Ordered: Current medicines are reviewed at length with the patient today.  Concerns regarding medicines are outlined above.  Orders Placed This Encounter  Procedures   EKG 12-Lead   No orders of the defined types were placed in this encounter.  Patient  Instructions  Medication Instructions:  No changes *If you need a refill on your cardiac medications before your next appointment, please call your pharmacy*   Lab Work: None ordered If you have labs (blood work) drawn today and your tests are completely normal, you will receive your results only by: [place] (if you have  MyChart) OR A paper copy in the mail If you have any lab test that is abnormal or we need to change your treatment, we will call you to review the results.   Testing/Procedures: None ordered   Follow-Up: At [facility], you and your health needs are our priority.  As part of our continuing mission to provide you with exceptional heart care, we have created designated Provider Care Teams.  These Care Teams include your primary Cardiologist (physician) and Advanced Practice Providers (APPs -  Physician Assistants and Nurse Practitioners) who all work together to provide you with the care you need, when you need it.  We recommend signing up for the patient portal called "MyChart".  Sign up information is provided on this After Visit Summary.  MyChart is used to connect with patients for Virtual Visits (Telemedicine).  Patients are able to view lab/test results, encounter notes, upcoming appointments, etc.  Non-urgent messages can be sent to your provider as well.   To learn more about what you can do with MyChart, go to [email or web address].    Your next appointment:   12 month(s)  The format for your next appointment:   In Person  Provider:   You may see Sanda Klein, MD or one of the following Advanced Practice Providers on your designated Care Team:   Almyra Deforest, PA-C Fabian Sharp, [place] or  Roby Lofts, PA-C    Signed, Sanda Klein, MD  06/07/2021 9:23 AM    [place]

## 2021-06-09 LAB — CUP PACEART REMOTE DEVICE CHECK
Date Time Interrogation Session: 20221103012905
Implantable Pulse Generator Implant Date: 20190315

## 2021-06-10 DIAGNOSIS — C61 Malignant neoplasm of prostate: Secondary | ICD-10-CM | POA: Diagnosis not present

## 2021-06-20 ENCOUNTER — Ambulatory Visit (INDEPENDENT_AMBULATORY_CARE_PROVIDER_SITE_OTHER): Payer: PPO

## 2021-06-20 DIAGNOSIS — Z87898 Personal history of other specified conditions: Secondary | ICD-10-CM

## 2021-06-28 NOTE — Progress Notes (Signed)
Carelink Summary Report / Loop Recorder 

## 2021-07-05 ENCOUNTER — Encounter: Payer: Self-pay | Admitting: Internal Medicine

## 2021-07-11 ENCOUNTER — Ambulatory Visit (INDEPENDENT_AMBULATORY_CARE_PROVIDER_SITE_OTHER): Payer: PPO

## 2021-07-11 DIAGNOSIS — I495 Sick sinus syndrome: Secondary | ICD-10-CM

## 2021-07-12 LAB — CUP PACEART REMOTE DEVICE CHECK
Date Time Interrogation Session: 20221204002858
Implantable Pulse Generator Implant Date: 20190315

## 2021-07-20 ENCOUNTER — Telehealth: Payer: Self-pay

## 2021-07-20 NOTE — Progress Notes (Signed)
Carelink Summary Report / Loop Recorder 

## 2021-07-20 NOTE — Addendum Note (Signed)
Addended by: Cheri Kearns A on: 07/20/2021 11:22 AM   Modules accepted: Level of Service

## 2021-07-20 NOTE — Telephone Encounter (Signed)
LINQ alert received, device has reached RRT 12/13  Attempted to reach patient, no answer, LVM.  VM got cut off/ sending my chart message as well.

## 2021-07-21 NOTE — Telephone Encounter (Signed)
I spoke with the patient and let him know that his loop device has reached RRT. He can choose to get it replace or keep it in. I told him if he chose to get it removed he can make in appointment with his doctor. I ordered the patient a return kit for his monitor.

## 2021-09-14 DIAGNOSIS — Z125 Encounter for screening for malignant neoplasm of prostate: Secondary | ICD-10-CM | POA: Diagnosis not present

## 2021-09-14 DIAGNOSIS — E785 Hyperlipidemia, unspecified: Secondary | ICD-10-CM | POA: Diagnosis not present

## 2021-09-14 DIAGNOSIS — R7301 Impaired fasting glucose: Secondary | ICD-10-CM | POA: Diagnosis not present

## 2021-09-22 DIAGNOSIS — R82998 Other abnormal findings in urine: Secondary | ICD-10-CM | POA: Diagnosis not present

## 2021-09-22 DIAGNOSIS — R030 Elevated blood-pressure reading, without diagnosis of hypertension: Secondary | ICD-10-CM | POA: Diagnosis not present

## 2021-09-22 DIAGNOSIS — Z86010 Personal history of colonic polyps: Secondary | ICD-10-CM | POA: Diagnosis not present

## 2021-09-22 DIAGNOSIS — I340 Nonrheumatic mitral (valve) insufficiency: Secondary | ICD-10-CM | POA: Diagnosis not present

## 2021-09-22 DIAGNOSIS — E785 Hyperlipidemia, unspecified: Secondary | ICD-10-CM | POA: Diagnosis not present

## 2021-09-22 DIAGNOSIS — R7301 Impaired fasting glucose: Secondary | ICD-10-CM | POA: Diagnosis not present

## 2021-09-22 DIAGNOSIS — K4090 Unilateral inguinal hernia, without obstruction or gangrene, not specified as recurrent: Secondary | ICD-10-CM | POA: Diagnosis not present

## 2021-09-22 DIAGNOSIS — Z0000 Encounter for general adult medical examination without abnormal findings: Secondary | ICD-10-CM | POA: Diagnosis not present

## 2021-09-22 DIAGNOSIS — D692 Other nonthrombocytopenic purpura: Secondary | ICD-10-CM | POA: Diagnosis not present

## 2021-09-22 DIAGNOSIS — Z1339 Encounter for screening examination for other mental health and behavioral disorders: Secondary | ICD-10-CM | POA: Diagnosis not present

## 2021-09-22 DIAGNOSIS — Z87898 Personal history of other specified conditions: Secondary | ICD-10-CM | POA: Diagnosis not present

## 2021-09-22 DIAGNOSIS — C61 Malignant neoplasm of prostate: Secondary | ICD-10-CM | POA: Diagnosis not present

## 2021-09-22 DIAGNOSIS — Z1331 Encounter for screening for depression: Secondary | ICD-10-CM | POA: Diagnosis not present

## 2021-11-25 DIAGNOSIS — K4091 Unilateral inguinal hernia, without obstruction or gangrene, recurrent: Secondary | ICD-10-CM | POA: Diagnosis not present

## 2021-11-30 DIAGNOSIS — L814 Other melanin hyperpigmentation: Secondary | ICD-10-CM | POA: Diagnosis not present

## 2021-11-30 DIAGNOSIS — D225 Melanocytic nevi of trunk: Secondary | ICD-10-CM | POA: Diagnosis not present

## 2021-11-30 DIAGNOSIS — L821 Other seborrheic keratosis: Secondary | ICD-10-CM | POA: Diagnosis not present

## 2021-11-30 DIAGNOSIS — D1801 Hemangioma of skin and subcutaneous tissue: Secondary | ICD-10-CM | POA: Diagnosis not present

## 2021-11-30 DIAGNOSIS — Z85828 Personal history of other malignant neoplasm of skin: Secondary | ICD-10-CM | POA: Diagnosis not present

## 2021-11-30 DIAGNOSIS — L570 Actinic keratosis: Secondary | ICD-10-CM | POA: Diagnosis not present

## 2021-12-05 DIAGNOSIS — K4090 Unilateral inguinal hernia, without obstruction or gangrene, not specified as recurrent: Secondary | ICD-10-CM

## 2021-12-05 HISTORY — DX: Unilateral inguinal hernia, without obstruction or gangrene, not specified as recurrent: K40.90

## 2021-12-14 DIAGNOSIS — H02834 Dermatochalasis of left upper eyelid: Secondary | ICD-10-CM | POA: Diagnosis not present

## 2021-12-14 DIAGNOSIS — H43811 Vitreous degeneration, right eye: Secondary | ICD-10-CM | POA: Diagnosis not present

## 2021-12-14 DIAGNOSIS — H02831 Dermatochalasis of right upper eyelid: Secondary | ICD-10-CM | POA: Diagnosis not present

## 2021-12-14 DIAGNOSIS — H04123 Dry eye syndrome of bilateral lacrimal glands: Secondary | ICD-10-CM | POA: Diagnosis not present

## 2021-12-14 DIAGNOSIS — H43392 Other vitreous opacities, left eye: Secondary | ICD-10-CM | POA: Diagnosis not present

## 2021-12-14 DIAGNOSIS — H2513 Age-related nuclear cataract, bilateral: Secondary | ICD-10-CM | POA: Diagnosis not present

## 2021-12-30 DIAGNOSIS — K4090 Unilateral inguinal hernia, without obstruction or gangrene, not specified as recurrent: Secondary | ICD-10-CM | POA: Diagnosis not present

## 2022-02-15 DIAGNOSIS — C61 Malignant neoplasm of prostate: Secondary | ICD-10-CM | POA: Diagnosis not present

## 2022-02-16 IMAGING — MR MR HEAD WO/W CM
14 series · 44 of 48 positions shown · IV contrast (multihance)
Comparison: None.

CLINICAL DATA: 74-year-old male with sensorineural hearing loss on
the left. Subjective tinnitus.

EXAM:
MRI HEAD WITHOUT AND WITH CONTRAST
TECHNIQUE: Multiplanar, multiecho pulse sequences of the brain and surrounding
structures were obtained without and with intravenous contrast.
CONTRAST:  15mL MULTIHANCE GADOBENATE DIMEGLUMINE 529 MG/ML IV SOLN

[Series 2: T1 · sagittal · 5.0mm · 0.45mm/px · 2 of 23 slices shown (1 of 3)]
[im 1/23]
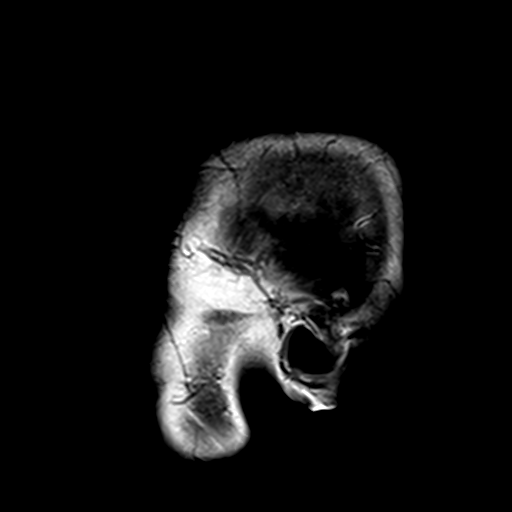
[im 23/23]
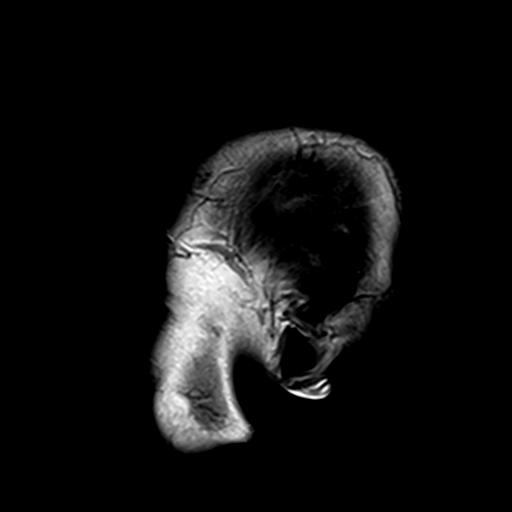

[Series 3: DWI · axial · 3.0mm · 1.80mm/px · z∈[-42,+104]mm · 9 of 100 slices shown (1 of 4)]
[im 1/100]
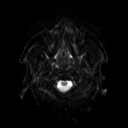
[im 13/100]
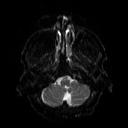
[im 25/100]
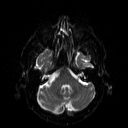
[im 38/100]
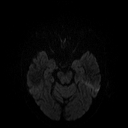
[im 50/100]
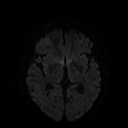
[im 62/100]
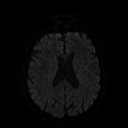
[im 75/100]
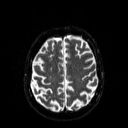
[im 87/100]
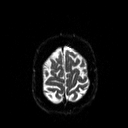
[im 100/100]
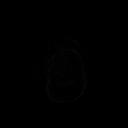

[Series 4: DWI · axial · 3.0mm · 1.80mm/px · z∈[-42,+104]mm · 5 of 50 slices shown (2 of 4)]
[im 1/50]
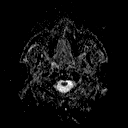
[im 13/50]
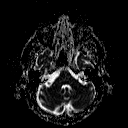
[im 25/50]
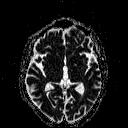
[im 37/50]
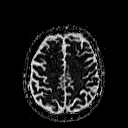
[im 50/50]
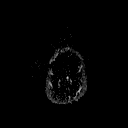

[Series 5: DWI · coronal · 5.0mm · 1.80mm/px · 6 of 67 slices shown (3 of 4)]
[im 1/67]
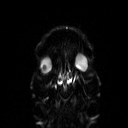
[im 14/67]
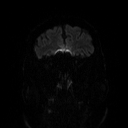
[im 27/67]
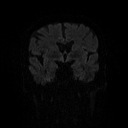
[im 40/67]
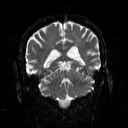
[im 53/67]
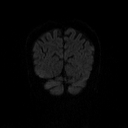
[im 67/67]
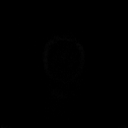

[Series 6: DWI · coronal · 5.0mm · 1.80mm/px · 3 of 32 slices shown (4 of 4)]
[im 1/32]
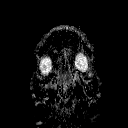
[im 16/32]
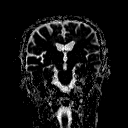
[im 32/32]
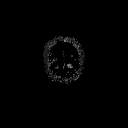

[Series 7: T2 · axial · 5.0mm · 0.60mm/px · z∈[-39,+102]mm · 2 of 21 slices shown]
[im 1/21]
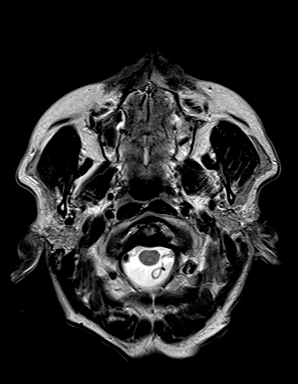
[im 21/21]
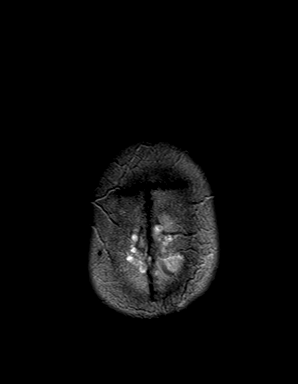

[Series 8: FLAIR · axial · 3.0mm · 0.45mm/px · z∈[-36,+98]mm · 3 of 30 slices shown]
[im 1/30]
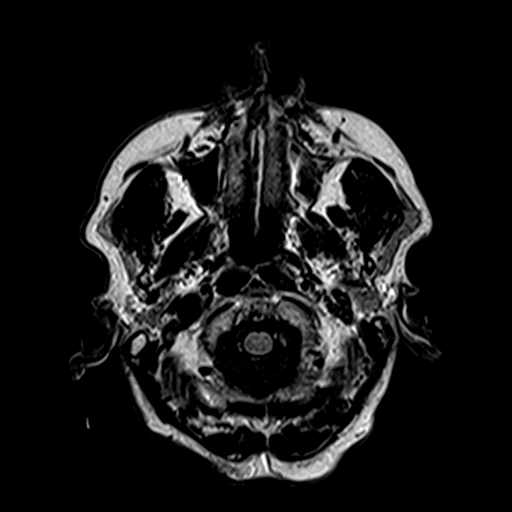
[im 15/30]
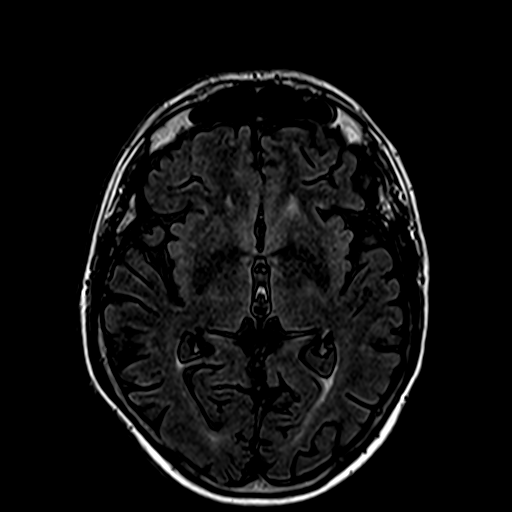
[im 30/30]
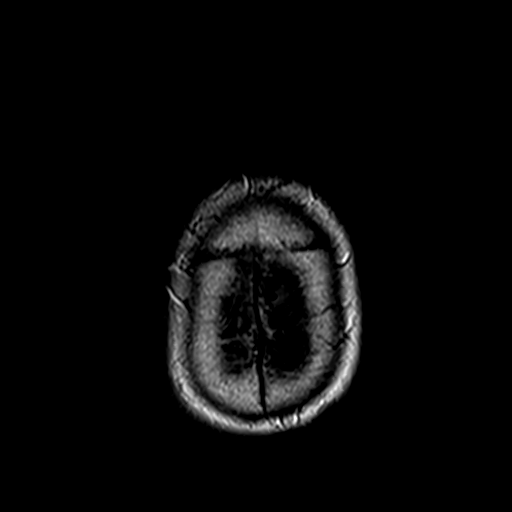

[Series 10: swi_images · axial · 4.0mm · 0.90mm/px · z∈[-63,+92]mm · 4 of 40 slices shown]
[im 1/40]
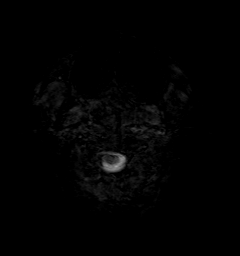
[im 14/40]
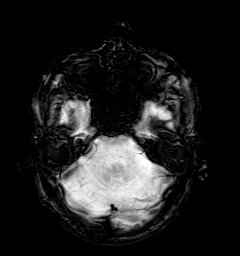
[im 27/40]
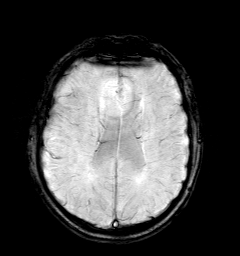
[im 40/40]
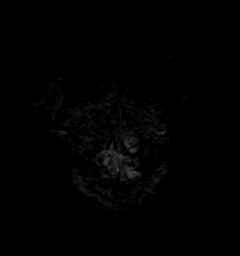

[Series 11: T1 · coronal · 3.0mm · 0.35mm/px · 1 of 11 slices shown (2 of 3)]
[im 1/11]
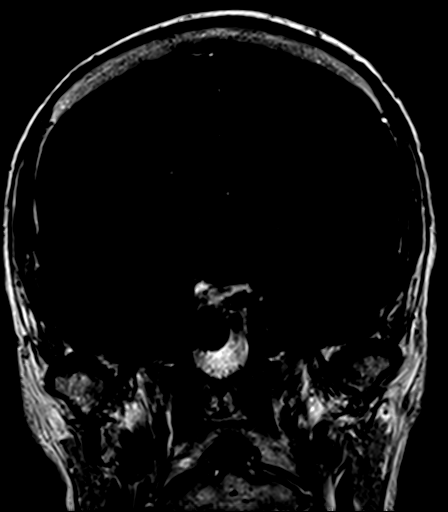

[Series 12: T1 · axial · 3.0mm · 0.35mm/px · 1 of 11 slices shown (3 of 3)]
[im 1/11]
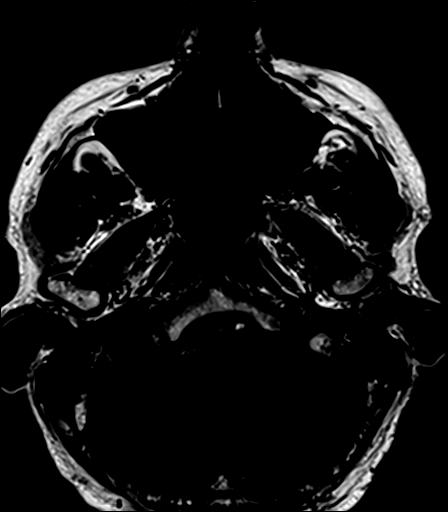

[Series 13: bSSFP · axial · 1.0mm · 0.28mm/px · z∈[-24,+11]mm · 3 of 36 slices shown]
[im 1/36]
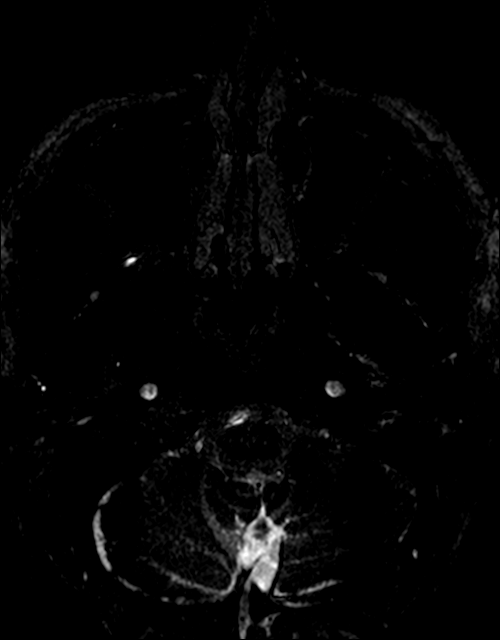
[im 18/36]
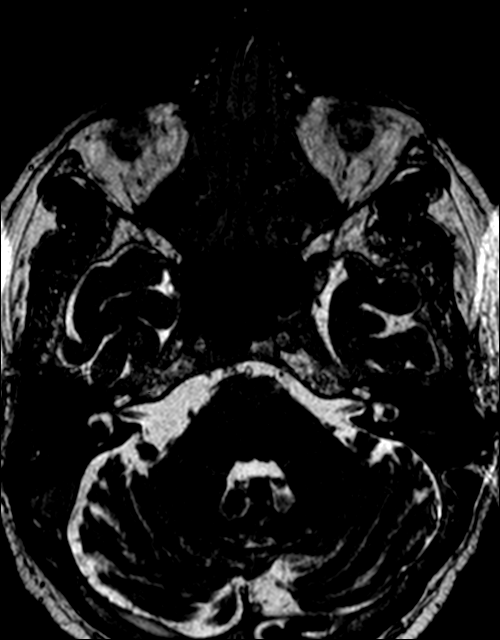
[im 36/36]
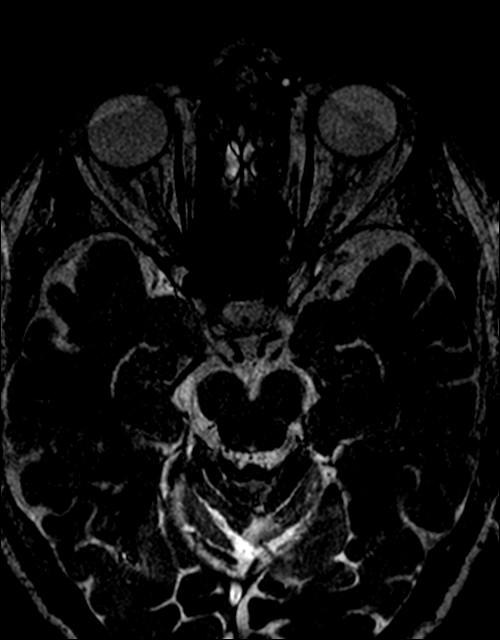

[Series 14: T1 post-contrast · coronal · 3.0mm · 0.35mm/px · 1 of 11 slices shown (1 of 2)]
[im 1/11]
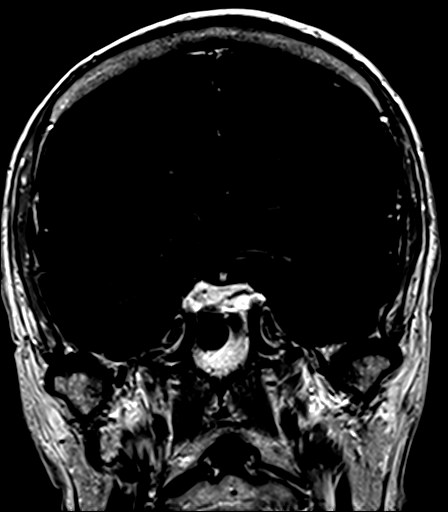

[Series 15: T1 post-contrast · axial · 3.0mm · 0.35mm/px · 1 of 11 slices shown (2 of 2)]
[im 1/11]
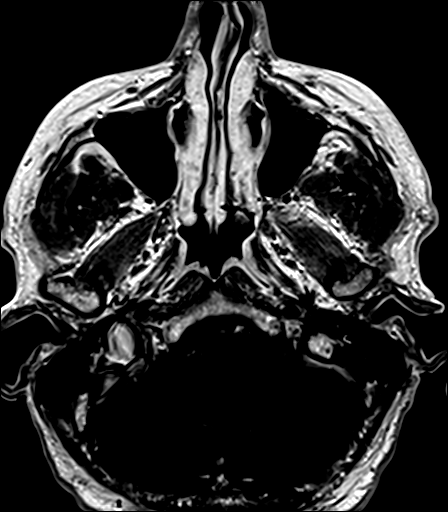

[Series 16: post_t1_mpr_tra · axial · 2.0mm · 0.45mm/px · z∈[-39,+7]mm · 3 of 72 slices shown]
[im 1/72]
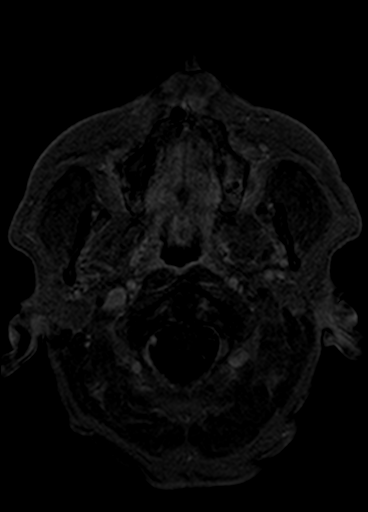
[im 12/72]
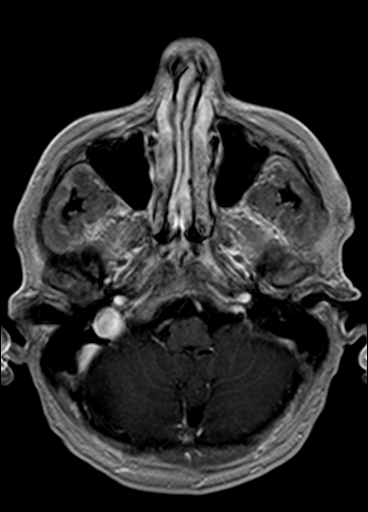
[im 24/72]
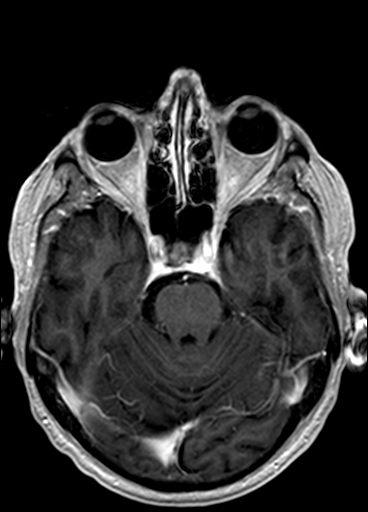

[44 of 48 positions shown; findings below may reference images not displayed]

FINDINGS: Brain: Cerebral volume is within normal limits for age. No
restricted diffusion to suggest acute infarction. No midline shift,
mass effect, evidence of mass lesion, ventriculomegaly, extra-axial
collection or acute intracranial hemorrhage. Cervicomedullary
junction and pituitary are within normal limits.

Mild for age scattered cerebral white matter, mostly
periventricular, T2 and FLAIR hyperintensity. No cortical
encephalomalacia. No chronic cerebral blood products. The deep gray
nuclei, brainstem and cerebellum appear normal.

No abnormal enhancement identified.

Vascular: Major intracranial vascular flow voids are preserved. The
right vertebral artery appears dominant.

Skull and upper cervical spine: Normal for age visible cervical
spine. Normal bone marrow signal.

Sinuses/Orbits: Trace bilateral paranasal sinus mucosal thickening.
Negative orbits. Visible scalp and face soft tissues appear
negative.

Other: Dedicated internal auditory imaging. Normal cerebellopontine
angles. Normal bilateral cisternal and intracanalicular 7th and 8th
cranial nerve segments. Symmetric T2 signal in the bilateral cochlea
and vestibular structures. Bilateral mastoids are clear. Normal
stylomastoid foramina. No abnormal enhancement identified. Dominant
right IJ bulb. No skull base abnormality identified. Parotid glands
appear negative.
IMPRESSION: 1. Normal internal auditory imaging.
2. No acute intracranial abnormality. Largely unremarkable for age
MRI appearance of the brain.

## 2022-03-03 DIAGNOSIS — C61 Malignant neoplasm of prostate: Secondary | ICD-10-CM | POA: Diagnosis not present

## 2022-04-04 ENCOUNTER — Ambulatory Visit (AMBULATORY_SURGERY_CENTER): Payer: PPO

## 2022-04-04 VITALS — Ht 67.0 in | Wt 135.0 lb

## 2022-04-04 DIAGNOSIS — Z86010 Personal history of colonic polyps: Secondary | ICD-10-CM

## 2022-04-04 MED ORDER — NA SULFATE-K SULFATE-MG SULF 17.5-3.13-1.6 GM/177ML PO SOLN: 1.0000 | ORAL | 0 refills | Status: DC

## 2022-04-04 NOTE — Progress Notes (Signed)
No egg or soy allergy known to patient  No issues known to pt with past sedation with any surgeries or procedures Patient denies ever being told they had issues or difficulty with intubation  No FH of Malignant Hyperthermia Pt is not on diet pills Pt is not on  home 02  Pt is not on blood thinners  Pt denies issues with constipation  No A fib or A flutter Have any cardiac testing pending--denied Pt instructed to use Single[employer] or GoodRx for a price reduction on prep   

## 2022-04-07 ENCOUNTER — Encounter: Payer: Self-pay | Admitting: Internal Medicine

## 2022-04-24 ENCOUNTER — Ambulatory Visit (AMBULATORY_SURGERY_CENTER): Payer: PPO | Admitting: Internal Medicine

## 2022-04-24 ENCOUNTER — Encounter: Payer: Self-pay | Admitting: Internal Medicine

## 2022-04-24 VITALS — BP 149/73 | HR 71 | Temp 98.0°F | Resp 13 | Ht 67.0 in | Wt 135.0 lb

## 2022-04-24 DIAGNOSIS — Z09 Encounter for follow-up examination after completed treatment for conditions other than malignant neoplasm: Secondary | ICD-10-CM | POA: Diagnosis not present

## 2022-04-24 DIAGNOSIS — Z86010 Personal history of colonic polyps: Secondary | ICD-10-CM | POA: Diagnosis not present

## 2022-04-24 DIAGNOSIS — D123 Benign neoplasm of transverse colon: Secondary | ICD-10-CM | POA: Diagnosis not present

## 2022-04-24 DIAGNOSIS — D122 Benign neoplasm of ascending colon: Secondary | ICD-10-CM

## 2022-04-24 MED ORDER — SODIUM CHLORIDE 0.9 % IV SOLN: 500.0000 mL | INTRAVENOUS | Status: DC

## 2022-04-24 NOTE — Patient Instructions (Signed)
Resume previous medications.  2 polyps removed and sent to pathology.  Await results for final recommendations.    Handouts on findings given to patient.   (Polyps)  YOU HAD AN ENDOSCOPIC PROCEDURE TODAY AT [facility]:   Refer to the procedure report that was given to you for any specific questions about what was found during the examination.  If the procedure report does not answer your questions, please call your gastroenterologist to clarify.  If you requested that your care partner not be given the details of your procedure findings, then the procedure report has been included in a sealed envelope for you to review at your convenience later.  YOU SHOULD EXPECT: Some feelings of bloating in the abdomen. Passage of more gas than usual.  Walking can help get rid of the air that was put into your GI tract during the procedure and reduce the bloating. If you had a lower endoscopy (such as a colonoscopy or flexible sigmoidoscopy) you may notice spotting of blood in your stool or on the toilet paper. If you underwent a bowel prep for your procedure, you may not have a normal bowel movement for a few days.  Please Note:  You might notice some irritation and congestion in your nose or some drainage.  This is from the oxygen used during your procedure.  There is no need for concern and it should clear up in a day or so.  SYMPTOMS TO REPORT IMMEDIATELY:  Following lower endoscopy (colonoscopy or flexible sigmoidoscopy):  Excessive amounts of blood in the stool  Significant tenderness or worsening of abdominal pains  Swelling of the abdomen that is new, acute  Fever of 100F or higher   For urgent or emergent issues, a gastroenterologist can be reached at any hour by calling [phone]. Do not use MyChart messaging for urgent concerns.    DIET:  We do recommend a small meal at first, but then you may proceed to your regular diet.  Drink plenty of fluids but you should avoid  alcoholic beverages for 24 hours.  ACTIVITY:  You should plan to take it easy for the rest of today and you should NOT DRIVE or use heavy machinery until tomorrow (because of the sedation medicines used during the test).    FOLLOW UP: Our staff will call the number listed on your records the next business day following your procedure.  We will call around 7:15- 8:00 am to check on you and address any questions or concerns that you may have regarding the information given to you following your procedure. If we do not reach you, we will leave a message.     If any biopsies were taken you will be contacted by phone or by letter within the next 1-3 weeks.  Please call us at [phone] if you have not heard about the biopsies in 3 weeks.    SIGNATURES/CONFIDENTIALITY: You and/or your care partner have signed paperwork which will be entered into your electronic medical record.  These signatures attest to the fact that that the information above on your After Visit Summary has been reviewed and is understood.  Full responsibility of the confidentiality of this discharge information lies with you and/or your care-partner.

## 2022-04-24 NOTE — Progress Notes (Signed)
GASTROENTEROLOGY PROCEDURE H&P NOTE   Primary Care Physician: Ginger Organ., MD    Reason for Procedure:  History of adenomatous colon polyps  Plan:    colonoscopy  Patient is appropriate for endoscopic procedure(s) in the ambulatory ([place]) setting.  The nature of the procedure, as well as the risks, benefits, and alternatives were carefully and thoroughly reviewed with the patient. Ample time for discussion and questions allowed. The patient understood, was satisfied, and agreed to proceed.     HPI: Tyler Waller is a 76 y.o. male who presents for surveillance colonoscopy.  Medical history as below.  Tolerated the prep.  No recent chest pain or shortness of breath.  No abdominal pain today.  Past Medical History:  Diagnosis Date   Cancer [facility])    skin cancer    GERD (gastroesophageal reflux disease)    Hernia, inguinal    Hyperlipidemia     Past Surgical History:  Procedure Laterality Date   COLONOSCOPY  2009, 2006, 2002, 2001   Dr. 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 Authorizing Provider  Multiple Vitamins-Minerals (CENTRUM SILVER ULTRA MENS PO) Take 1 tablet by mouth every Monday, Wednesday, and Friday.   Yes [provider]  Naphazoline-Pheniramine 0.027-0.315 % SOLN Place 2 drops into both eyes daily.   Yes [provider]  rosuvastatin (CRESTOR) 5 MG tablet Take 5 mg by mouth daily.    Yes [provider]  sildenafil (VIAGRA) 100 MG tablet TAKE ONE TABLET BY MOUTH DAILY AS NEEDED for 30    [provider]    Current Outpatient Medications  Medication Sig Dispense Refill   Multiple  Vitamins-Minerals (CENTRUM SILVER ULTRA MENS PO) Take 1 tablet by mouth every Monday, Wednesday, and Friday.     Naphazoline-Pheniramine 0.027-0.315 % SOLN Place 2 drops into both eyes daily.     rosuvastatin (CRESTOR) 5 MG tablet Take 5 mg by mouth daily.      sildenafil (VIAGRA) 100 MG tablet TAKE ONE TABLET BY MOUTH DAILY AS NEEDED for 30     Current Facility-Administered Medications  Medication Dose Route Frequency Provider Last Rate Last Admin   0.9 %  sodium chloride infusion  500 mL Intravenous Continuous Damel Querry, Lajuan Lines, MD        Allergies as of 04/24/2022   (No Known Allergies)    Family History  Problem Relation Age of Onset   Lymphoma Mother    Lung cancer Father    AAA (abdominal aortic aneurysm) Brother    Healthy Daughter    Healthy Son    Colon cancer Neg Hx    Stomach cancer Neg Hx    Esophageal cancer Neg Hx    Rectal cancer Neg Hx    Colon polyps Neg Hx     Social History   Socioeconomic History   Marital status: Married    Spouse name: Not on file   Number of children: Not on file   Years of education: Not on file   Highest education level: Not on file  Occupational History   Not on file  Tobacco Use   Smoking status: Former  Types: Cigarettes    Quit date: 07/18/1967    Years since quitting: 54.8   Smokeless tobacco: Never  Vaping Use   Vaping Use: Never used  Substance and Sexual Activity   Alcohol use: No   Drug use: No   Sexual activity: Not on file  Other Topics Concern   Not on file  Social History Narrative   Not on file   Social Determinants of Health   Financial Resource Strain: Not on file  Food Insecurity: Not on file  Transportation Needs: Not on file  Physical Activity: Not on file  Stress: Not on file  Social Connections: Not on file  Intimate Partner Violence: Not on file    Physical Exam: Vital signs in last 24 hours: '@BP'$  (!) 144/81   Pulse 94   Temp 98 F (36.7 C)   Ht '5\' 7"'$  (1.702 m)   Wt 135 lb (61.2 kg)    SpO2 97%   BMI 21.14 kg/m  GEN: NAD EYE: Sclerae anicteric ENT: MMM CV: Non-tachycardic Pulm: CTA b/l GI: Soft, NT/ND NEURO:  Alert & Oriented x 3   Zenovia Jarred, MD [facility]  04/24/2022 1:19 PM

## 2022-04-24 NOTE — Progress Notes (Signed)
Pt's states no medical or surgical changes since previsit or office visit. 

## 2022-04-24 NOTE — Progress Notes (Signed)
Called to room to assist during endoscopic procedure.  Patient ID and intended procedure confirmed with present staff. Received instructions for my participation in the procedure from the performing physician.  

## 2022-04-24 NOTE — Progress Notes (Signed)
Pt to PACU. Report given. SBAR complete and questions answered. VSS. Airway intact.

## 2022-04-24 NOTE — Op Note (Signed)
[place] Patient Name: Tyler Waller Procedure Date: 04/24/2022 1:14 PM MRN: [id number] Endoscopist: Jerene Bears , MD Age: 76 Referring MD:  Date of Birth: 1945/09/16 Gender: Male Account #: [id number] Procedure:                Colonoscopy Indications:              High risk colon cancer surveillance: Personal                            history of adenomatous polyps including adenoma (10                            mm or greater in size) at last colonoscopy: October                            2019 Medicines:                Monitored Anesthesia Care Procedure:                Pre-Anesthesia Assessment:                           - Prior to the procedure, a History and Physical                            was performed, and patient medications and                            allergies were reviewed. The patient's tolerance of                            previous anesthesia was also reviewed. The risks                            and benefits of the procedure and the sedation                            options and risks were discussed with the patient.                            All questions were answered, and informed consent                            was obtained. Prior Anticoagulants: The patient has                            taken no previous anticoagulant or antiplatelet                            agents. ASA Grade Assessment: II - A patient with                            mild systemic disease. After reviewing the risks  and benefits, the patient was deemed in                            satisfactory condition to undergo the procedure.                           After obtaining informed consent, the colonoscope                            was passed under direct vision. Throughout the                            procedure, the patient's blood pressure, pulse, and                            oxygen saturations were monitored continuously. The                             CF [id number] #[id number] was introduced through the anus                            and advanced to the cecum, identified by                            appendiceal orifice and ileocecal valve. The                            colonoscopy was performed without difficulty. The                            patient tolerated the procedure well. The quality                            of the bowel preparation was good. The ileocecal                            valve, appendiceal orifice, and rectum were                            photographed. Scope In: 1:35:06 PM Scope Out: 1:54:56 PM Scope Withdrawal Time: 0 hours 14 minutes 51 seconds  Total Procedure Duration: 0 hours 19 minutes 50 seconds  Findings:                 The digital rectal exam was normal.                           A 6 mm polyp was found in the ascending colon. The                            polyp was sessile. The polyp was removed with a                            cold snare. Resection and retrieval were complete.  A 3 mm polyp was found in the transverse colon. The                            polyp was sessile. The polyp was removed with a                            cold snare. Resection and retrieval were complete.                           The retroflexed view of the distal rectum and anal                            verge was normal and showed no anal or rectal                            abnormalities. Complications:            No immediate complications. Estimated Blood Loss:     Estimated blood loss was minimal. Impression:               - One 6 mm polyp in the ascending colon, removed                            with a cold snare. Resected and retrieved.                           - One 3 mm polyp in the transverse colon, removed                            with a cold snare. Resected and retrieved.                           - The distal rectum and anal verge are normal on                             retroflexion view. Recommendation:           - Patient has a contact number available for                            emergencies. The signs and symptoms of potential                            delayed complications were discussed with the                            patient. Return to normal activities tomorrow.                            Written discharge instructions were provided to the                            patient.                           -  Resume previous diet.                           - Continue present medications.                           - Await pathology results.                           - No recommendation at this time regarding repeat                            colonoscopy due to today's findings and age at next                            surveillance. Jerene Bears, MD 04/24/2022 2:01:26 PM This report has been signed electronically.

## 2022-04-25 ENCOUNTER — Telehealth: Payer: Self-pay

## 2022-04-25 NOTE — Telephone Encounter (Signed)
  Follow up Call-     04/24/2022   12:37 PM  Call back number  Post procedure Call Back phone  # [phone]  Permission to leave phone message Yes     Patient questions:  Do you have a fever, pain , or abdominal swelling? No. Pain Score  0 *  Have you tolerated food without any problems? Yes.    Have you been able to return to your normal activities? Yes.    Do you have any questions about your discharge instructions: Diet   No. Medications  No. Follow up visit  No.  Do you have questions or concerns about your Care? No.  Actions: * If pain score is 4 or above: No action needed, pain <4.

## 2022-05-01 ENCOUNTER — Encounter: Payer: Self-pay | Admitting: Internal Medicine

## 2022-08-30 DIAGNOSIS — C61 Malignant neoplasm of prostate: Secondary | ICD-10-CM | POA: Diagnosis not present

## 2022-09-06 DIAGNOSIS — C61 Malignant neoplasm of prostate: Secondary | ICD-10-CM | POA: Diagnosis not present

## 2022-09-11 ENCOUNTER — Encounter: Payer: Self-pay | Admitting: Cardiovascular Disease

## 2022-09-11 ENCOUNTER — Ambulatory Visit: Payer: HMO | Attending: Cardiovascular Disease | Admitting: Cardiovascular Disease

## 2022-09-11 VITALS — BP 134/76 | HR 75 | Ht 67.0 in | Wt 134.8 lb

## 2022-09-11 DIAGNOSIS — Z87898 Personal history of other specified conditions: Secondary | ICD-10-CM

## 2022-09-11 DIAGNOSIS — E7800 Pure hypercholesterolemia, unspecified: Secondary | ICD-10-CM | POA: Diagnosis not present

## 2022-09-11 DIAGNOSIS — Z4509 Encounter for adjustment and management of other cardiac device: Secondary | ICD-10-CM | POA: Diagnosis not present

## 2022-09-11 DIAGNOSIS — I341 Nonrheumatic mitral (valve) prolapse: Secondary | ICD-10-CM

## 2022-09-11 NOTE — Progress Notes (Signed)
Cardiology office note:    Date:  09/11/2022   ID:  Tyler Waller, DOB Dec 25, 1945, MRN [id number]  PCP:  Ginger Organ., MD  Cardiologist:  Loyce Flaming  Referring MD: Ginger Organ., MD   Chief Complaint  Patient presents with   Cardiac Valve Problem     History of Present Illness:    Tyler Waller is a 77 y.o. male with a hx of an episode of syncope, s/p ILR implantation in March 2019, but no recurrence in the ensuing 4 years of follow-up.  He has a physical exam strongly suggestive of mitral valve prolapse with mitral regurgitation.  He has mild hypercholesterolemia on statin, without known CAD/PAD.  He continues to remain very physically active, walking most days of the week between half an hour an hour.  Avoids becoming lightheaded by not holding his breath when he bends over.  No other cardiovascular complaints. The patient specifically denies any chest pain at rest exertion, dyspnea at rest or with exertion, orthopnea, paroxysmal nocturnal dyspnea, syncope, palpitations, focal neurological deficits, intermittent claudication, lower extremity edema, unexplained weight gain, cough, hemoptysis or wheezing.  The loop recorder has reached end of service.  Remains very lean and fit.  All the metabolic parameters are excellent with A1c 5.1% and LDL cholesterol 83, HDL 102.  ECG today shows mild sinus tachycardia, but he tells me he walked up the stairs to our office, 3 floors.  At home his usual heart rate is in the 90s.  His grandchildren in [place] are now ages 75 and 1 respectively.  They both play soccer  and flag football.  Tyler Waller and his wife were taking care of their grandchild when she noticed that he developed rather slow and slurred speech.  He did not have any other lateralizing or focal neurological signs.  As they were walking, his wife noticed that he stumbled across the steps.  When he entered the house, he lost consciousness and collapsed to  the floor and hit his head.  He was picked up by EMS and brought to the emergency room.  He did not fully recover from confusion for about 2 hours.  He did not have loss of sphincter tone did not have any convulsions.  He has no residual sequelae from this event.  He is also completely amnestic for the events.  He does not remember if he had any prodromal symptoms.  While at [name] and [place] he reportedly underwent both CT and MRI of the head which were normal and an echocardiogram with bubble study which was also reportedly normal.  He had no rhythm abnormalities on telemetry.  He had minimal rhythm abnormalities on the subsequent 30-day event monitor, isolated PVCs.  He had another syncopal event about 20 years ago.  He remembers walking towards the bathroom and falling backwards against his bed, losing consciousness very briefly if at all.  He reports a poorly defined history of "seizures" when he was only 77 years old.  He does not know any details about this.  He has relatively mild hypercholesterolemia at 219, but with an outstanding HDL cholesterol 127 and LDL of only 84 he does not have hypertension, diabetes mellitus history of smoking or family history of premature coronary artery disease.  His sister did receive a pacemaker around age of 30.  He has a brother with a known abdominal aortic aneurysm.  His father died of lung cancer in his 42s and his  mother died of lymphoma in her late 27s.  He denies knowing that he has any heart problems, but an echocardiogram performed in 2008 showed mitral valve prolapse with trivial mitral regurgitation.  On exam today he actually has a significant holosystolic murmur at the apex, sounds more than trivial.  I do not have the actual echo report from [place].    His electrocardiogram is completely normal.  This shows sinus rhythm.  The QT interval is 417 ms.  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  Therefore he has a reasonable understanding of medical terminology and pathology.   Past Medical History:  Diagnosis Date   Cancer [facility])    skin cancer    GERD (gastroesophageal reflux disease)    Hernia, inguinal    Hyperlipidemia     Past Surgical History:  Procedure Laterality Date   COLONOSCOPY  2009, 2006, 2002, 2001   Dr. Sharlett Iles; hx tubular adenomas   HAND SURGERY  1999   right hand Dupreyen's contracture   LOOP RECORDER INSERTION N/A 10/19/2017   Procedure: LOOP RECORDER INSERTION;  Surgeon: Sanda Klein, MD;  Location: [facility];  Service: Cardiovascular;  Laterality: N/A;   TONSILLECTOMY  1968    Current Medications: Current Meds  Medication Sig   Multiple Vitamins-Minerals (CENTRUM SILVER ULTRA MENS PO) Take 1 tablet by mouth every Monday, Wednesday, and Friday.   Naphazoline-Pheniramine 0.027-0.315 % SOLN Place 2 drops into both eyes daily.   rosuvastatin (CRESTOR) 5 MG tablet Take 5 mg by mouth daily.    sildenafil (VIAGRA) 100 MG tablet TAKE ONE TABLET BY MOUTH DAILY AS NEEDED for 30     Allergies:   Patient has no known allergies.   Social History   Socioeconomic History   Marital status: Married    Spouse name: Not on file   Number of children: Not on file   Years of education: Not on file   Highest education level: Not on file  Occupational History   Not on file  Tobacco Use   Smoking status: Former    Types: Cigarettes    Quit date: 07/18/1967    Years since quitting: 55.1   Smokeless tobacco: Never  Vaping Use   Vaping Use: Never used  Substance and Sexual Activity   Alcohol use: No   Drug use: No   Sexual activity: Not on file  Other Topics Concern   Not on file  Social History Narrative   Not on file   Social Determinants of Health   Financial Resource Strain: Not on file  Food Insecurity: Not on file  Transportation Needs: Not on file  Physical Activity: Not on file   Stress: Not on file  Social Connections: Not on file     Family History: The patient's family history includes AAA (abdominal aortic aneurysm) in his brother; Healthy in his daughter and son; Lung cancer in his father; Lymphoma in his mother. There is no history of Colon cancer, Stomach cancer, Esophageal cancer, Rectal cancer, or Colon polyps.  ROS:   Please see the history of present illness.   All other systems are reviewed and are negative.  EKGs/Labs/Other Studies Reviewed:    The following studies were reviewed today: most recent loop recorder download from earlier this month  EKG:  EKG is ordered today.  It shows mild sinus tachycardia otherwise completely normal  Recent Labs: From Dr. Raul Del office 08/25/2020 Hemoglobin  A1c 5.0%, potassium 4.8, ALT 28, TSH 1.51  Recent Lipid Panel From Dr. Raul Del office 08/25/2020 Total cholesterol 176, HDL 81, LDL 85, triglycerides 49  Physical Exam:    VS:  BP 134/76 (BP Location: Left Arm, Patient Position: Sitting, Cuff Size: Normal)   Pulse 75   Ht '5\' 7"'$  (1.702 m)   Wt 134 lb 12.8 oz (61.1 kg)   SpO2 97%   BMI 21.11 kg/m     Wt Readings from Last 3 Encounters:  09/11/22 134 lb 12.8 oz (61.1 kg)  04/24/22 135 lb (61.2 kg)  04/04/22 135 lb (61.2 kg)     General: Alert, oriented x3, no distress, appears very lean and fit Head: no evidence of trauma, PERRL, EOMI, no exophtalmos or lid lag, no myxedema, no xanthelasma; normal ears, nose and oropharynx Neck: normal jugular venous pulsations and no hepatojugular reflux; brisk carotid pulses without delay and no carotid bruits Chest: clear to auscultation, no signs of consolidation by percussion or palpation, normal fremitus, symmetrical and full respiratory excursions Cardiovascular: normal position and quality of the apical impulse, regular rhythm, normal first and second heart sounds, midsystolic click and musical 6-7/8 late systolic murmur are heard at the apex and intensify  with a Valsalva maneuver, no diastolic murmurs, rubs or gallops Abdomen: no tenderness or distention, no masses by palpation, no abnormal pulsatility or arterial bruits, normal bowel sounds, no hepatosplenomegaly Extremities: no clubbing, cyanosis or edema; 2+ radial, ulnar and brachial pulses bilaterally; 2+ right femoral, posterior tibial and dorsalis pedis pulses; 2+ left femoral, posterior tibial and dorsalis pedis pulses; no subclavian or femoral bruits Neurological: grossly nonfocal Psych: Normal mood and affect    ASSESSMENT:    1. History of syncope   2. Encounter for loop recorder at end of battery life   3. Mitral valve prolapse   4. Hypercholesterolemia     PLAN:    In order of problems listed above:  Syncope: No recent events.  He has had 2 unusual syncopal events roughly 20 years apart.  No syncope since loop recorder implantation in 2019.  Retrospectively, the most recent event may have been related to straining and bending over while getting out of a tall SUV followed by immediately climbing some steep stairs. ILR: No arrhythmia detected since implantation.  At this point, he prefers to just leave the device in place rather than explant. 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  We discussed possible complications including endocarditis and chordal rupture with sudden worsening of dyspnea.  The echo from [facility] in [place] showed "mild mitral regurgitation" without mention of prolapse or myxomatous changes.  The left ventricle was normal in size and function.  Further evaluation does not appear necessary since he is asymptomatic.  We will repeat an echocardiogram if he develops dyspnea.   Hypercholesterolemia : Currently, on statin therapy his LDL is within target range at less than 100.  He has advanced age, male gender, family history of CAD and increased  total cholesterol.  Normal treadmill stress test on October 16, 2017.     Medication Adjustments/Labs and Tests Ordered: Current medicines are reviewed at length with the patient today.  Concerns regarding medicines are outlined above.  Orders Placed This Encounter  Procedures   EKG 12-Lead   No orders of the defined types were placed in this encounter.   Patient Instructions  Medication Instructions:  No changes *If you  need a refill on your cardiac medications before your next appointment, please call your pharmacy*   Follow-Up: At [facility], you and your health needs are our priority.  As part of our continuing mission to provide you with exceptional heart care, we have created designated Provider Care Teams.  These Care Teams include your primary Cardiologist (physician) and Advanced Practice Providers (APPs -  Physician Assistants and Nurse Practitioners) who all work together to provide you with the care you need, when you need it.  We recommend signing up for the patient portal called "MyChart".  Sign up information is provided on this After Visit Summary.  MyChart is used to connect with patients for Virtual Visits (Telemedicine).  Patients are able to view lab/test results, encounter notes, upcoming appointments, etc.  Non-urgent messages can be sent to your provider as well.   To learn more about what you can do with MyChart, go to [email or web address].    Your next appointment:   1 year(s)  Provider:   Sanda Klein, MD        Signed, Sanda Klein, MD  09/11/2022 9:48 AM    [place]

## 2022-09-11 NOTE — Patient Instructions (Signed)
Medication Instructions:  No changes *If you need a refill on your cardiac medications before your next appointment, please call your pharmacy*  Follow-Up: At [facility], you and your health needs are our priority.  As part of our continuing mission to provide you with exceptional heart care, we have created designated Provider Care Teams.  These Care Teams include your primary Cardiologist (physician) and Advanced Practice Providers (APPs -  Physician Assistants and Nurse Practitioners) who all work together to provide you with the care you need, when you need it.  We recommend signing up for the patient portal called "MyChart".  Sign up information is provided on this After Visit Summary.  MyChart is used to connect with patients for Virtual Visits (Telemedicine).  Patients are able to view lab/test results, encounter notes, upcoming appointments, etc.  Non-urgent messages can be sent to your provider as well.   To learn more about what you can do with MyChart, go to [email or web address].    Your next appointment:   1 year(s)  Provider:   Mihai Croitoru, MD      

## 2022-09-18 ENCOUNTER — Other Ambulatory Visit: Payer: Self-pay | Admitting: Urology

## 2022-09-18 DIAGNOSIS — C61 Malignant neoplasm of prostate: Secondary | ICD-10-CM

## 2022-10-04 DIAGNOSIS — Z125 Encounter for screening for malignant neoplasm of prostate: Secondary | ICD-10-CM | POA: Diagnosis not present

## 2022-10-04 DIAGNOSIS — R7301 Impaired fasting glucose: Secondary | ICD-10-CM | POA: Diagnosis not present

## 2022-10-04 DIAGNOSIS — E785 Hyperlipidemia, unspecified: Secondary | ICD-10-CM | POA: Diagnosis not present

## 2022-10-04 DIAGNOSIS — R030 Elevated blood-pressure reading, without diagnosis of hypertension: Secondary | ICD-10-CM | POA: Diagnosis not present

## 2022-10-11 DIAGNOSIS — Z86010 Personal history of colonic polyps: Secondary | ICD-10-CM | POA: Diagnosis not present

## 2022-10-11 DIAGNOSIS — N529 Male erectile dysfunction, unspecified: Secondary | ICD-10-CM | POA: Diagnosis not present

## 2022-10-11 DIAGNOSIS — C61 Malignant neoplasm of prostate: Secondary | ICD-10-CM | POA: Diagnosis not present

## 2022-10-11 DIAGNOSIS — Z1331 Encounter for screening for depression: Secondary | ICD-10-CM | POA: Diagnosis not present

## 2022-10-11 DIAGNOSIS — R030 Elevated blood-pressure reading, without diagnosis of hypertension: Secondary | ICD-10-CM | POA: Diagnosis not present

## 2022-10-11 DIAGNOSIS — Z1339 Encounter for screening examination for other mental health and behavioral disorders: Secondary | ICD-10-CM | POA: Diagnosis not present

## 2022-10-11 DIAGNOSIS — Z23 Encounter for immunization: Secondary | ICD-10-CM | POA: Diagnosis not present

## 2022-10-11 DIAGNOSIS — Z0000 Encounter for general adult medical examination without abnormal findings: Secondary | ICD-10-CM | POA: Diagnosis not present

## 2022-10-11 DIAGNOSIS — D692 Other nonthrombocytopenic purpura: Secondary | ICD-10-CM | POA: Diagnosis not present

## 2022-10-11 DIAGNOSIS — E785 Hyperlipidemia, unspecified: Secondary | ICD-10-CM | POA: Diagnosis not present

## 2022-10-11 DIAGNOSIS — H9192 Unspecified hearing loss, left ear: Secondary | ICD-10-CM | POA: Diagnosis not present

## 2022-10-11 DIAGNOSIS — M720 Palmar fascial fibromatosis [Dupuytren]: Secondary | ICD-10-CM | POA: Diagnosis not present

## 2022-10-11 DIAGNOSIS — R7301 Impaired fasting glucose: Secondary | ICD-10-CM | POA: Diagnosis not present

## 2022-10-11 DIAGNOSIS — R82998 Other abnormal findings in urine: Secondary | ICD-10-CM | POA: Diagnosis not present

## 2022-10-23 ENCOUNTER — Ambulatory Visit
Admission: RE | Admit: 2022-10-23 | Discharge: 2022-10-23 | Disposition: A | Discharge status: Home / Self Care | Payer: HMO | Source: Ambulatory Visit | Attending: Urology | Admitting: Urology

## 2022-10-23 DIAGNOSIS — C61 Malignant neoplasm of prostate: Secondary | ICD-10-CM | POA: Diagnosis not present

## 2022-10-23 MED ORDER — GADOPICLENOL 0.5 MMOL/ML IV SOLN
6.0000 mL | Freq: Once | INTRAVENOUS | Status: AC | PRN
  Administered 2022-10-23: 6 mL via INTRAVENOUS

## 2022-11-21 DIAGNOSIS — C61 Malignant neoplasm of prostate: Secondary | ICD-10-CM | POA: Diagnosis not present

## 2022-11-21 DIAGNOSIS — D075 Carcinoma in situ of prostate: Secondary | ICD-10-CM | POA: Diagnosis not present

## 2022-11-30 ENCOUNTER — Other Ambulatory Visit (HOSPITAL_COMMUNITY): Payer: Self-pay | Admitting: Urology

## 2022-11-30 DIAGNOSIS — C61 Malignant neoplasm of prostate: Secondary | ICD-10-CM

## 2022-12-04 ENCOUNTER — Telehealth: Payer: Self-pay

## 2022-12-04 NOTE — Telephone Encounter (Signed)
    1. I confirmed with the patient he is aware of his referral to the clinic 5/10, arriving @ 8:30 am.    2. I discussed the format of the clinic and the physicians he will be seeing that day.   3. I discussed where the clinic is located and how to contact me.   4. I confirmed his address and informed him I would be mailing a packet of information and forms to be completed. I asked him to bring them with him the day of his appointment.    He voiced understanding of the above. I asked him to call me if he has any questions or concerns regarding his appointments or the forms he needs to complete.

## 2022-12-14 ENCOUNTER — Encounter (HOSPITAL_COMMUNITY)
Admission: RE | Admit: 2022-12-14 | Discharge: 2022-12-14 | Disposition: A | Discharge status: Home / Self Care | Payer: HMO | Source: Ambulatory Visit | Attending: Urology | Admitting: Urology

## 2022-12-14 DIAGNOSIS — C61 Malignant neoplasm of prostate: Secondary | ICD-10-CM

## 2022-12-14 MED ORDER — PIFLIFOLASTAT F 18 (PYLARIFY) INJECTION
9.0000 | Freq: Once | INTRAVENOUS | Status: AC
  Administered 2022-12-14: 8.8 via INTRAVENOUS

## 2022-12-14 NOTE — Progress Notes (Addendum)
                               Care Plan Summary  Name: Moriah Huffines DOB: 27-Jul-1946   Your Medical Team:   Urologist -  Dr. Heloise Purpura, [facility]  Radiation Oncologist - Dr. Margaretmary Dys, [facility]     Recommendations: 1) Radiation     * These recommendations are based on information available as of today's consult.      Recommendations may change depending on the results of further tests or exams.    Next Steps: 1) You will be contacted to set up for fiducial marker's and spaceOAR gel.  2) CT Simulation appointment at a later day will follow.  3) Daily radiation typically will start approximately 1 week after simulation.     When appointments need to be scheduled, you will be contacted by [facility] and/or [facility].  Questions?  Please do not hesitate to call Cherlyn Cushing, BSN, RN at [phone] with any questions or concerns.  Marisue Ivan is your Oncology Nurse Navigator and is available to assist you while you're receiving your medical care at [facility].

## 2022-12-14 NOTE — Progress Notes (Signed)
RN spoke with patient and reviewed upcoming appointment time and date.

## 2022-12-15 ENCOUNTER — Other Ambulatory Visit: Payer: Self-pay

## 2022-12-15 ENCOUNTER — Telehealth: Payer: Self-pay | Admitting: Genetic Counselor

## 2022-12-15 ENCOUNTER — Ambulatory Visit
Admission: RE | Admit: 2022-12-15 | Discharge: 2022-12-15 | Disposition: A | Discharge status: Home / Self Care | Payer: HMO | Source: Ambulatory Visit | Attending: Radiation Oncology | Admitting: Radiation Oncology

## 2022-12-15 ENCOUNTER — Encounter: Payer: Self-pay | Admitting: Genetic Counselor

## 2022-12-15 VITALS — BP 133/74 | HR 94 | Temp 96.0°F | Resp 18 | Ht 67.0 in | Wt 134.1 lb

## 2022-12-15 DIAGNOSIS — C61 Malignant neoplasm of prostate: Secondary | ICD-10-CM | POA: Insufficient documentation

## 2022-12-15 DIAGNOSIS — Z191 Hormone sensitive malignancy status: Secondary | ICD-10-CM | POA: Diagnosis not present

## 2022-12-15 NOTE — Addendum Note (Signed)
Encounter addended by: Roel Cluck, RN on: 12/15/2022 3:53 PM  Actions taken: Flowsheet accepted

## 2022-12-15 NOTE — Progress Notes (Signed)
Radiation Oncology         [phone] ________________________________  Initial Outpatient Consultation  Name: Tyler Waller MRN: [id number]  Date: 12/15/2022  DOB: [birth year]  WU:JWJX, Netta Corrigan., MD  Heloise Purpura, MD   REFERRING PHYSICIAN: Heloise Purpura, MD  DIAGNOSIS: 77 y.o. gentleman with Stage T1c adenocarcinoma of the prostate with Gleason score of 4+3, and PSA of 14.5.    ICD-10-CM   1. Malignant neoplasm of prostate (HCC)  C61       HISTORY OF PRESENT ILLNESS: Tyler Waller is a 77 y.o. male with a diagnosis of prostate cancer. He was noted to have an elevated PSA of 6.78 in December 2019. Despite a drop in his PSA to 4.72, he proceeded with TRUS with biopsy on 10/25/18, yielding Gleason 6 cancer in 5% of 1 out of 12 biopsies in the right apex.    He underwent surveillance MRI prostate on 05/05/19 showing: PI-RADS 3 lesion in right apical posterolateral and right anterior peripheral zone. He underwent surveillance fusion biopsy on 06/13/2019, which again showed Gleason 6 in 5-10% of 2 out of 12 cores, at right lateral mid and right lateral base.  The two ROI biopsies were negative.    His PSA continued to rise and reached 9.74 in 04/2021. This prompted another surveillance biopsy on 06/10/21, which again showed Gleason 6 in 5% of 1 of 12 cores. He continued on active surveillance. His PSA increased to 11.5 in 02/2022 and then 14.5 in 08/2022. This prompted a repeat prostate MRI on 10/23/22 showing: PI-RADS 4 lesion of bilateral anterior transition zone and fibromuscular stroma; two PI-RADS 3 lesions in right peripheral zone.    The patient proceeded to repeat MRI fusion transrectal ultrasound with 12 biopsies of the prostate on 11/21/22.  The prostate volume measured 33 cc.  Out of 24 core biopsies, 5 were positive.  The maximum Gleason score was 4+3, and this was seen in one sample from ROI #3 right anterior (small focus). Additionally, Gleason 3+4 was seen in two other  samples from ROI #3 (with perineural invasion), and Gleason 3+3 in right apex (small focus) and right mid lateral.    He underwent staging PSMA PET scan on 12/14/22 showing no evidence of disease outside of the prostate.     The patient reviewed the biopsy results with his urologist and he has kindly been referred today for discussion of potential radiation treatment options.   PREVIOUS RADIATION THERAPY: No  PAST MEDICAL HISTORY:  Past Medical History:  Diagnosis Date   Cancer (HCC)    skin cancer    GERD (gastroesophageal reflux disease)    Hernia, inguinal Left 12/2021   Hyperlipidemia       PAST SURGICAL HISTORY: Past Surgical History:  Procedure Laterality Date   COLONOSCOPY  2009, 2006, 2002, 2001   Dr. Jarold Motto; hx tubular adenomas   HAND SURGERY  08/07/1997   right hand Dupreyen's contracture   LOOP RECORDER INSERTION N/A 10/19/2017   Procedure: LOOP RECORDER INSERTION;  Surgeon: Thurmon Fair, MD;  Location: [facility];  Service: Cardiovascular;  Laterality: N/A;   PROSTATE BIOPSY     TONSILLECTOMY  08/07/1966    FAMILY HISTORY:  Family History  Problem Relation Age of Onset   Lymphoma Mother 75   Lung cancer Father 3   AAA (abdominal aortic aneurysm) Brother    Healthy Daughter    Healthy Son    Colon cancer Neg Hx    Stomach cancer Neg  Hx    Esophageal cancer Neg Hx    Rectal cancer Neg Hx    Colon polyps Neg Hx     SOCIAL HISTORY:  Social History   Socioeconomic History   Marital status: Married    Spouse name: Not on file   Number of children: Not on file   Years of education: Not on file   Highest education level: Not on file  Occupational History   Not on file  Tobacco Use   Smoking status: Former    Types: Cigarettes    Quit date: 07/18/1967    Years since quitting: 55.4   Smokeless tobacco: Never  Vaping Use   Vaping Use: Never used  Substance and Sexual Activity   Alcohol use: No   Drug use: No   Sexual activity:  Yes  Other Topics Concern   Not on file  Social History Narrative   Not on file   Social Determinants of Health   Financial Resource Strain: Not on file  Food Insecurity: Not on file  Transportation Needs: Not on file  Physical Activity: Not on file  Stress: Not on file  Social Connections: Not on file  Intimate Partner Violence: Not on file    ALLERGIES: Patient has no known allergies.  MEDICATIONS:  Current Outpatient Medications  Medication Sig Dispense Refill   Multiple Vitamins-Minerals (CENTRUM SILVER ULTRA MENS PO) Take 1 tablet by mouth every Monday, Wednesday, and Friday.     rosuvastatin (CRESTOR) 5 MG tablet Take 5 mg by mouth daily.      sildenafil (VIAGRA) 100 MG tablet TAKE ONE TABLET BY MOUTH DAILY AS NEEDED for 30     No current facility-administered medications for this encounter.      REVIEW OF SYSTEMS:  On review of systems, the patient reports that he is doing well overall. He denies any chest pain, shortness of breath, cough, fevers, chills, night sweats, unintended weight changes. He denies any bowel disturbances, and denies abdominal pain, nausea or vomiting. He denies any new musculoskeletal or joint aches or pains. His IPSS was 6, indicating mild urinary symptoms (Reference 0-7 mild, 8-19 moderate, 20-35 severe).   A complete review of systems is obtained and is otherwise negative.     PHYSICAL EXAM:  Wt Readings from Last 3 Encounters:  12/15/22 134 lb 2 oz (60.8 kg)  09/11/22 134 lb 12.8 oz (61.1 kg)  04/24/22 135 lb (61.2 kg)   Temp Readings from Last 3 Encounters:  12/15/22 (!) 96 F (35.6 C) (Temporal)  04/24/22 98 F (36.7 C)  02/11/19 98.1 F (36.7 C)   BP Readings from Last 3 Encounters:  12/15/22 133/74  09/11/22 134/76  04/24/22 (!) 149/73   Pulse Readings from Last 3 Encounters:  12/15/22 94  09/11/22 75  04/24/22 71    /10  In general this is a well appearing male in no acute distress. He's alert and oriented x4 and  appropriate throughout the examination. Cardiopulmonary assessment is negative for acute distress, and he exhibits normal effort.     KPS = 100  100 - Normal; no complaints; no evidence of disease. 90   - Able to carry on normal activity; minor signs or symptoms of disease. 80   - Normal activity with effort; some signs or symptoms of disease. 99   - Cares for self; unable to carry on normal activity or to do active work. 60   - Requires occasional assistance, but is able to care for most of  his personal needs. 50   - Requires considerable assistance and frequent medical care. 40   - Disabled; requires special care and assistance. 30   - Severely disabled; hospital admission is indicated although death not imminent. 20   - Very sick; hospital admission necessary; active supportive treatment necessary. 10   - Moribund; fatal processes progressing rapidly. 0     - Dead  Karnofsky DA, Abelmann WH, Craver LS and Burchenal JH [phone]) The use of the nitrogen mustards in the palliative treatment of carcinoma: with particular reference to bronchogenic carcinoma Cancer 1 634-56  LABORATORY DATA:  No results found for: "WBC", "HGB", "HCT", "MCV", "PLT" No results found for: "NA", "K", "CL", "CO2" No results found for: "ALT", "AST", "GGT", "ALKPHOS", "BILITOT"   RADIOGRAPHY: NM PET (PSMA) SKULL TO MID THIGH  Result Date: 12/14/2022 CLINICAL DATA:  High risk prostate carcinoma.  PSA equal 14.5. EXAM: NUCLEAR MEDICINE PET SKULL BASE TO THIGH TECHNIQUE: 8.8 mCi F18 Piflufolastat (Pylarify) was injected intravenously. Full-ring PET imaging was performed from the skull base to thigh after the radiotracer. CT data was obtained and used for attenuation correction and anatomic localization. COMPARISON:  MRI prostate 10/23/2022 FINDINGS: NECK No radiotracer activity in neck lymph nodes. Incidental CT finding: None. CHEST No radiotracer accumulation within mediastinal or hilar lymph nodes. No suspicious pulmonary  nodules on the CT scan. Incidental CT finding: Coronary artery calcification and aortic atherosclerotic calcification. ABDOMEN/PELVIS Prostate: Intense focus of radiotracer activity in the anterior RIGHT lobe of the prostate gland with SUV max equal 17.8 (image 206). This corresponds to suspicious lesion on comparison MRI which was targeted. Seminal vesicles normal. Lymph nodes: No abnormal radiotracer accumulation within pelvic or abdominal nodes. Liver: No evidence of liver metastasis. Incidental CT finding: None. SKELETON No focal activity to suggest skeletal metastasis. No sclerotic or lytic lesions on CT imaging. IMPRESSION: 1. Intense radiotracer activity in the anterior RIGHT lobe of the prostate gland consistent with primary prostate adenocarcinoma. Corresponding targeted finding on prostate MRI. 2. No evidence of metastatic adenopathy in the pelvis or periaortic retroperitoneum. 3. No evidence of visceral metastasis or skeletal metastasis. 4.  Aortic Atherosclerosis (ICD10-I70.0). Electronically Signed   By: Genevive Bi M.D.   On: 12/14/2022 16:06      IMPRESSION/PLAN: 1. 77 y.o. gentleman with Stage T1c adenocarcinoma of the prostate with Gleason Score of 4+3, and PSA of 14.5. We discussed the patient's workup and outlined the nature of prostate cancer in this setting. The patient's T stage, Gleason's score, and PSA put him into the favorable intermediate risk group. Accordingly, he is eligible for a variety of potential treatment options including brachytherapy, 5.5-8 weeks of external radiation, or prostatectomy. We discussed the available radiation techniques, and focused on the details and logistics of delivery. We discussed and outlined the risks, benefits, short and long-term effects associated with radiotherapy and compared and contrasted these with prostatectomy. We discussed the role of SpaceOAR gel in reducing the rectal toxicity associated with radiotherapy.  He appears to have a good  understanding of his disease and our treatment recommendations which are of curative intent.  He was encouraged to ask questions that were answered to his stated satisfaction.  At the conclusion of our conversation, the patient is interested in moving forward with IMRT, so, we'll work to get gold fiducials and SpaceOAR followed by CT simulation..  We personally spent 60 minutes in this encounter including chart review, reviewing radiological studies, meeting face-to-face with the patient, entering orders and completing documentation.   ------------------------------------------------  Margaretmary Dys, MD [facility]  Radiation Oncology Direct Dial: [phone]  Fax: [phone] [email or web address]  Skype  [email or web address]   This document serves as a record of services personally performed by Margaretmary Dys, MD. It was created on his behalf by Mickie Bail, a trained medical scribe. The creation of this record is based on the scribe's personal observations and the provider's statements to them. This document has been checked and approved by the attending provider.

## 2022-12-15 NOTE — Consult Note (Signed)
Multi-Disciplinary Clinic     12/15/2022   --------------------------------------------------------------------------------   Tyler Waller  MRN: [id number]  DOB: 11-01-1945, 77 year old Male  SSN: [id number]   PRIMARY CARE:  W Tyler Brazil, MD  PRIMARY CARE FAX:  [phone]  REFERRING:  Azucena Kuba  PROVIDER:  Heloise Purpura, M.D.  LOCATION:  [facility] [phone]     --------------------------------------------------------------------------------   CC/HPI: CC: Prostate Cancer   PCP: Dr. Eric Form  Location of consult: [facility] - Prostate Cancer Multidisciplinary Clinic   [name] is a 77 year old gentleman with a history of hyperlipidemia. He was initially diagnosed with prostate cancer in March 2020 when he was found to have an elevated PSA of 4.73 prompting a TRUS biopsy of the prostate on 10/25/18. This confirmed Gleason 3+3=6 adenocarcinoma of the prostate with 1 out of 12 biopsy cores positive for malignancy. He elected active surveillance management initially. He underwent surveillance evaluations with an MRI in September 2020 indicating a small PI-RADS 3 lesion at the right apex. Subsequent biopsies in November 2020 and again in November 2022 confirmed Gleason 3+3=6 disease. His PSA gradually increased up to 11.3 in July 2023 and further increased to 14.5 in January 2024. He underwent a repeat MRI of the prostate on 10/23/22 which demonstrated a 1.7 cm PI-RADS 3 right apical peripheral zone lesion (ROI-1), a 1.2 cm PI-RADS 3 right mid/base peripheral zone lesion (ROI-2), and a new 1.8 cm PI-RADS 4 bilateral anterior transition zone lesion (ROI-3). He proceeded with an MR/US fusion biopsy on 11/21/22 which confirmed upgraded Gleason 4+3=7 adenocarcinoma in the ROI-3 targeted biopsies. Overall, 2 out of 12 systematic biopsies were positive for Gleason 3+3=6 disease. 3 out of 4 of the ROI-3 targeted biopsies were positive.   Imaging:  PSMA PET scan (12/14/22) -negative for metastatic disease. He did have uptake in the vicinity of his anteriorly located lesion on MRI.   PMH: Hypertension. He has a history of intermittent tachyarrhythmia s/p implanted AICD. He also has a history of a questionable TIA. He has hyperlipidemia. Carotid artery studies were normal.   TNM stage: cT1c Nx Mx  PSA at diagnosis: 14.5  Gleason score: 4+3=7 (GG 3)  Biopsy (11/21/22): 5/24 cores positive  Left: Benign  Right: R apex (5%), R mid (10%, 3+3=6)  ROI-1: Benign  ROI-2: Benign  ROI-3: 3/4 cores (3+4=7 in 2 cores - 20%, 20%), (4+3=7, 5%)  Prostate volume: 33.1 cc   Nomogram  OC disease: 38%  EPE: 57%  LNI: 8%  SVI: 5%  5 year PFS (surgery): 58%  10 year PFS (surgery): 42%   Urinary function: IPSS is 6.  Erectile function: SHIM score is 16.     ALLERGIES: No Allergies    MEDICATIONS: Crestor  Levofloxacin 750 mg tablet Please take one tablet the morning of your biopsy.  Advil  Aleve  Multiple Vitamin  Sildenafil Citrate 100 mg tablet  Tylenol  Valtrex     GU PSH: Prostate Needle Biopsy - 11/21/2022, 06/10/2021, 2020, 2020 Vasectomy     NON-GU PSH: Surgical Pathology, Gross And Microscopic Examination For Prostate Needle - 11/21/2022, 06/10/2021, 2020, 2020     GU PMH: Prostate Cancer - 11/21/2022, - 09/06/2022, - 03/03/2022, - 06/10/2021, - 05/04/2021, - 2022, - 06/25/2020, - 2020      PMH Notes:   1) Prostate cancer: He was found to have an elevated PSA of 4.73 prompting a TRUS biopsy of the  prostate on 10/25/18. This confirmed Gleason 3+3=6 adenocarcinoma of the prostate with 1 out of 12 biopsy cores positive for malignancy. He elected active surveillance management initially.   PMH: He has a history of intermittent tachyarrhythmia s/p implanted AICD. He also has a history of a questionable TIA. Carotid artery studies were normal.   Initial diagnosis: March 2020  TNM stage: cT1c Nx Mx  PSA at diagnosis: 4.73  Gleason  score: 3+3=6  Biopsy (10/25/18): 1/12 cores positive  Left: Benign  Right: R apex (5%)  Prostate volume: 31.4 cc  PSAD: 0.15   Baseline urinary function: IPSS is 1.  Baseline erectile function: SHIM score is 25.   Surveillance:  Sep 2020: MRI - 0.3 cm right apical posterolateral PI-RADS 3 lesion  Nov 2020: MR/ [place] fusion biopsy - 2/30 cores positive, R mid lateral (10%, 3+3=6), R base lateral (5%, 3+3=6), Targeted biopsies negative, Vol 30.7 cc  Nov 2022: 12 core biopsy - 1/12 cores positive (5%, Gleason 3+3=6, R lateral mid), Vol 29.0 cc     NON-GU PMH: Hypercholesterolemia    FAMILY HISTORY: abdominal aortic aneurysm - Brother Kidney Stones - Son Lung Cancer - Father lymphoma - Mother    Notes: 1 son, 1 daughter   SOCIAL HISTORY: Marital Status: Married Preferred Language: English; Ethnicity: Not Hispanic Or Latino; Race: White Current Smoking Status: Patient does not smoke anymore. Has not smoked since 09/07/1968. Smoked for 2 years. Smoked 1/2 pack per day.   Tobacco Use Assessment Completed: Used Tobacco in last 30 days? Does not drink anymore.  Drinks 4+ caffeinated drinks per day.    REVIEW OF SYSTEMS:    GU Review Male:   Patient denies frequent urination, hard to postpone urination, burning/ pain with urination, get up at night to urinate, leakage of urine, stream starts and stops, trouble starting your streams, and have to strain to urinate .  Gastrointestinal (Lower):   Patient denies diarrhea and constipation.  Gastrointestinal (Upper):   Patient denies nausea and vomiting.  Constitutional:   Patient denies fever, night sweats, weight loss, and fatigue.  Skin:   Patient denies skin rash/ lesion and itching.  Eyes:   Patient denies blurred vision and double vision.  Ears/ Nose/ Throat:   Patient denies sore throat and sinus problems.  Hematologic/Lymphatic:   Patient denies swollen glands and easy bruising.  Cardiovascular:   Patient denies leg swelling and chest  pains.  Respiratory:   Patient denies cough and shortness of breath.  Endocrine:   Patient denies excessive thirst.  Musculoskeletal:   Patient denies back pain and joint pain.  Neurological:   Patient denies headaches and dizziness.  Psychologic:   Patient denies depression and anxiety.   VITAL SIGNS: None   MULTI-SYSTEM PHYSICAL EXAMINATION:    Constitutional: Well-nourished. No physical deformities. Normally developed. Good grooming.     Complexity of Data:  Lab Test Review:   PSA  Records Review:   Pathology Reports, Previous Patient Records  X-Ray Review: PET- PSMA Scan: Reviewed Films.     08/30/22 02/15/22 04/27/21 09/15/20 06/15/20 12/10/19 05/07/19 09/25/18  PSA  Total PSA 14.50 ng/mL 11.30 ng/mL 9.74 ng/mL 9.04 ng/mL 12.20 ng/mL 8.18 ng/mL 7.11 ng/mL 4.73 ng/mL  Free PSA        0.59 ng/mL  % Free PSA        12 % PSA   Notes:                     CLINICAL  DATA: High risk prostate carcinoma. PSA equal 14.5.   EXAM:  NUCLEAR MEDICINE PET SKULL BASE TO THIGH   TECHNIQUE:  8.8 mCi F18 Piflufolastat (Pylarify) was injected intravenously.  Full-ring PET imaging was performed from the skull base to thigh  after the radiotracer. CT data was obtained and used for attenuation  correction and anatomic localization.   COMPARISON: MRI prostate 10/23/2022   FINDINGS:  NECK   No radiotracer activity in neck lymph nodes.   Incidental CT finding: None.   CHEST   No radiotracer accumulation within mediastinal or hilar lymph nodes.  No suspicious pulmonary nodules on the CT scan.   Incidental CT finding: Coronary artery calcification and aortic  atherosclerotic calcification.   ABDOMEN/PELVIS   Prostate: Intense focus of radiotracer activity in the anterior  RIGHT lobe of the prostate gland with SUV max equal 17.8 (image  206). This corresponds to suspicious lesion on comparison MRI which  was targeted.   Seminal vesicles normal.   Lymph nodes: No abnormal  radiotracer accumulation within pelvic or  abdominal nodes.   Liver: No evidence of liver metastasis.   Incidental CT finding: None.   SKELETON   No focal activity to suggest skeletal metastasis. No sclerotic or  lytic lesions on CT imaging.   IMPRESSION:  1. Intense radiotracer activity in the anterior RIGHT lobe of the  prostate gland consistent with primary prostate adenocarcinoma.  Corresponding targeted finding on prostate MRI.  2. No evidence of metastatic adenopathy in the pelvis or periaortic  retroperitoneum.  3. No evidence of visceral metastasis or skeletal metastasis.  4. Aortic Atherosclerosis (ICD10-I70.0).    Electronically Signed  By: Genevive Bi M.D.  On: 12/14/2022 16:06   PROCEDURES: None   ASSESSMENT:      ICD-10 Details  1 GU:   Prostate Cancer - C61    PLAN:           Document Letter(s):  Created for Patient: Clinical Summary         Notes:   1. Unfavorable intermediate risk prostate cancer: We reviewed his PSMA PET scan that does not demonstrate evidence of metastatic disease. Based on the progression of his cancer that was upgraded on his most recent biopsy, we reviewed options for treatment. The patient was counseled about the natural history of prostate cancer and the standard treatment options that are available for prostate cancer. It was explained to him how his age and life expectancy, clinical stage, Gleason score/prognostic grade group, and PSA (and PSA density) affect his prognosis, the decision to proceed with additional staging studies, as well as how that information influences recommended treatment strategies. We discussed the roles for active surveillance, radiation therapy, surgical therapy, androgen deprivation, as well as ablative therapy and other investigational options for the treatment of prostate cancer as appropriate to his individual cancer situation. We discussed the risks and benefits of these options with regard to their  impact on cancer control and also in terms of potential adverse events, complications, and impact on quality of life particularly related to urinary and sexual function. The patient was encouraged to ask questions throughout the discussion today and all questions were answered to his stated satisfaction. In addition, the patient was provided with and/or directed to appropriate resources and literature for further education about prostate cancer and treatment options.   At this time, he is most interested in proceeding with IMRT for definitive therapy after his prior discussion with Dr. Kathrynn Running. Considering his relatively low volume disease, Dr.  Manny did not feel that he would require short-term androgen deprivation therapy and I do agree with him. He will be scheduled for fiducial marker placement and SpaceOAR hydrogel insertion with plans to begin treatment in the near future. I will schedule him for ongoing PSA surveillance following his treatment.   CC: Dr. Eric Form  Dr. Margaretmary Dys      E & M CODES: We spent 32 minutes dedicated to evaluation and management time, including face to face interaction, discussions on coordination of care, documentation, result review, and discussion with others as applicable.

## 2022-12-15 NOTE — Telephone Encounter (Signed)
Mr. Gosch was seen by a genetic counselor during the prostate multidisciplinary clinic on 12/15/2022. In addition to his personal history of prostate cancer, he reported his mother was diagnosed with lymphoma at age 76 and his father was diagnosed with lung cancer at age 62. He does not meet NCCN criteria for genetic testing at this time. He was still offered genetic counseling and testing but declined. We encourage him to contact us if there are any changes to his personal or family history of cancer. If he meets NCCN criteria based on the updated personal/family history, he would be recommended to have genetic counseling and testing.   Lalla Brothers, MS, [facility] Genetic Counselor [email or web address] (P) [phone]

## 2022-12-19 ENCOUNTER — Other Ambulatory Visit: Payer: Self-pay

## 2022-12-19 ENCOUNTER — Other Ambulatory Visit: Payer: Self-pay | Admitting: Urology

## 2022-12-19 ENCOUNTER — Encounter (HOSPITAL_BASED_OUTPATIENT_CLINIC_OR_DEPARTMENT_OTHER): Payer: Self-pay | Admitting: Urology

## 2022-12-19 NOTE — Progress Notes (Addendum)
Spoke w/ via phone for pre-op interview---pt Lab needs dos----  I stat  , orders need 2nd sign         Lab results------lov dr croitoru 10-01-2022 epic, EKG 09-11-2022 epic COVID test -----patient states asymptomatic no test needed Arrive at -------530 am 01-04-2023 NPO after MN NO Solid Food.  Clear liquids from MN until---430 am Med rec completed Medications to take morning of surgery -----rosuvastatin Diabetic medication -----n/a Patient instructed no nail polish to be worn day of surgery Patient instructed to bring photo id and insurance card day of surgery Patient aware to have Driver (ride ) / caregiver   wife Tyler Waller  for 24 hours after surgery  Patient Special Instructions -----fleets enema qhs night before surgery Pre-Op special Instructions -----n/a Patient verbalized understanding of instructions that were given at this phone interview. Patient denies shortness of breath, chest pain, fever, cough at this phone interview.

## 2023-01-03 NOTE — H&P (Signed)
Multi-Disciplinary Clinic     12/15/2022   --------------------------------------------------------------------------------   Tyler Waller. Mayr  MRN: [id number]  DOB: 1946/06/23, 77 year old Male  SSN: [id number]   PRIMARY CARE:  W Leola Brazil, MD  PRIMARY CARE FAX:  [phone]  REFERRING:  Azucena Kuba  PROVIDER:  Heloise Purpura, M.D.  LOCATION:  [facility] [phone]     --------------------------------------------------------------------------------   CC/HPI: CC: Prostate Cancer   PCP: Dr. Eric Form  Location of consult: [facility] - Prostate Cancer Multidisciplinary Clinic   Tyler Waller is a 77 year old gentleman with a history of hyperlipidemia. He was initially diagnosed with prostate cancer in March 2020 when he was found to have an elevated PSA of 4.73 prompting a TRUS biopsy of the prostate on 10/25/18. This confirmed Gleason 3+3=6 adenocarcinoma of the prostate with 1 out of 12 biopsy cores positive for malignancy. He elected active surveillance management initially. He underwent surveillance evaluations with an MRI in September 2020 indicating a small PI-RADS 3 lesion at the right apex. Subsequent biopsies in November 2020 and again in November 2022 confirmed Gleason 3+3=6 disease. His PSA gradually increased up to 11.3 in July 2023 and further increased to 14.5 in January 2024. He underwent a repeat MRI of the prostate on 10/23/22 which demonstrated a 1.7 cm PI-RADS 3 right apical peripheral zone lesion (ROI-1), a 1.2 cm PI-RADS 3 right mid/base peripheral zone lesion (ROI-2), and a new 1.8 cm PI-RADS 4 bilateral anterior transition zone lesion (ROI-3). He proceeded with an MR/US fusion biopsy on 11/21/22 which confirmed upgraded Gleason 4+3=7 adenocarcinoma in the ROI-3 targeted biopsies. Overall, 2 out of 12 systematic biopsies were positive for Gleason 3+3=6 disease. 3 out of 4 of the ROI-3 targeted biopsies were positive.   Imaging:  PSMA PET scan (12/14/22) -negative for metastatic disease. He did have uptake in the vicinity of his anteriorly located lesion on MRI.   PMH: Hypertension. He has a history of intermittent tachyarrhythmia s/p implanted AICD. He also has a history of a questionable TIA. He has hyperlipidemia. Carotid artery studies were normal.   TNM stage: cT1c Nx Mx  PSA at diagnosis: 14.5  Gleason score: 4+3=7 (GG 3)  Biopsy (11/21/22): 5/24 cores positive  Left: Benign  Right: R apex (5%), R mid (10%, 3+3=6)  ROI-1: Benign  ROI-2: Benign  ROI-3: 3/4 cores (3+4=7 in 2 cores - 20%, 20%), (4+3=7, 5%)  Prostate volume: 33.1 cc   Nomogram  OC disease: 38%  EPE: 57%  LNI: 8%  SVI: 5%  5 year PFS (surgery): 58%  10 year PFS (surgery): 42%   Urinary function: IPSS is 6.  Erectile function: SHIM score is 16.     ALLERGIES: No Allergies    MEDICATIONS: Crestor  Levofloxacin 750 mg tablet Please take one tablet the morning of your biopsy.  Advil  Aleve  Multiple Vitamin  Sildenafil Citrate 100 mg tablet  Tylenol  Valtrex     GU PSH: Prostate Needle Biopsy - 11/21/2022, 06/10/2021, 2020, 2020 Vasectomy     NON-GU PSH: Surgical Pathology, Gross And Microscopic Examination For Prostate Needle - 11/21/2022, 06/10/2021, 2020, 2020     GU PMH: Prostate Cancer - 11/21/2022, - 09/06/2022, - 03/03/2022, - 06/10/2021, - 05/04/2021, - 2022, - 06/25/2020, - 2020      PMH Notes:   1) Prostate cancer: He was found to have an elevated PSA of 4.73 prompting a TRUS biopsy of the  prostate on 10/25/18. This confirmed Gleason 3+3=6 adenocarcinoma of the prostate with 1 out of 12 biopsy cores positive for malignancy. He elected active surveillance management initially.   PMH: He has a history of intermittent tachyarrhythmia s/p implanted AICD. He also has a history of a questionable TIA. Carotid artery studies were normal.   Initial diagnosis: March 2020  TNM stage: cT1c Nx Mx  PSA at diagnosis: 4.73  Gleason  score: 3+3=6  Biopsy (10/25/18): 1/12 cores positive  Left: Benign  Right: R apex (5%)  Prostate volume: 31.4 cc  PSAD: 0.15   Baseline urinary function: IPSS is 1.  Baseline erectile function: SHIM score is 25.   Surveillance:  Sep 2020: MRI - 0.3 cm right apical posterolateral PI-RADS 3 lesion  Nov 2020: MR/ [place] fusion biopsy - 2/30 cores positive, R mid lateral (10%, 3+3=6), R base lateral (5%, 3+3=6), Targeted biopsies negative, Vol 30.7 cc  Nov 2022: 12 core biopsy - 1/12 cores positive (5%, Gleason 3+3=6, R lateral mid), Vol 29.0 cc     NON-GU PMH: Hypercholesterolemia    FAMILY HISTORY: abdominal aortic aneurysm - Brother Kidney Stones - Son Lung Cancer - Father lymphoma - Mother    Notes: 1 son, 1 daughter   SOCIAL HISTORY: Marital Status: Married Preferred Language: English; Ethnicity: Not Hispanic Or Latino; Race: White Current Smoking Status: Patient does not smoke anymore. Has not smoked since 09/07/1968. Smoked for 2 years. Smoked 1/2 pack per day.   Tobacco Use Assessment Completed: Used Tobacco in last 30 days? Does not drink anymore.  Drinks 4+ caffeinated drinks per day.    REVIEW OF SYSTEMS:    GU Review Male:   Patient denies frequent urination, hard to postpone urination, burning/ pain with urination, get up at night to urinate, leakage of urine, stream starts and stops, trouble starting your streams, and have to strain to urinate .  Gastrointestinal (Lower):   Patient denies diarrhea and constipation.  Gastrointestinal (Upper):   Patient denies nausea and vomiting.  Constitutional:   Patient denies fever, night sweats, weight loss, and fatigue.  Skin:   Patient denies skin rash/ lesion and itching.  Eyes:   Patient denies blurred vision and double vision.  Ears/ Nose/ Throat:   Patient denies sore throat and sinus problems.  Hematologic/Lymphatic:   Patient denies swollen glands and easy bruising.  Cardiovascular:   Patient denies leg swelling and chest  pains.  Respiratory:   Patient denies cough and shortness of breath.  Endocrine:   Patient denies excessive thirst.  Musculoskeletal:   Patient denies back pain and joint pain.  Neurological:   Patient denies headaches and dizziness.  Psychologic:   Patient denies depression and anxiety.   VITAL SIGNS: None   MULTI-SYSTEM PHYSICAL EXAMINATION:    Constitutional: Well-nourished. No physical deformities. Normally developed. Good grooming.     Complexity of Data:  Lab Test Review:   PSA  Records Review:   Pathology Reports, Previous Patient Records  X-Ray Review: PET- PSMA Scan: Reviewed Films.     08/30/22 02/15/22 04/27/21 09/15/20 06/15/20 12/10/19 05/07/19 09/25/18  PSA  Total PSA 14.50 ng/mL 11.30 ng/mL 9.74 ng/mL 9.04 ng/mL 12.20 ng/mL 8.18 ng/mL 7.11 ng/mL 4.73 ng/mL  Free PSA        0.59 ng/mL  % Free PSA        12 % PSA   Notes:                     CLINICAL  DATA: High risk prostate carcinoma. PSA equal 14.5.   EXAM:  NUCLEAR MEDICINE PET SKULL BASE TO THIGH   TECHNIQUE:  8.8 mCi F18 Piflufolastat (Pylarify) was injected intravenously.  Full-ring PET imaging was performed from the skull base to thigh  after the radiotracer. CT data was obtained and used for attenuation  correction and anatomic localization.   COMPARISON: MRI prostate 10/23/2022   FINDINGS:  NECK   No radiotracer activity in neck lymph nodes.   Incidental CT finding: None.   CHEST   No radiotracer accumulation within mediastinal or hilar lymph nodes.  No suspicious pulmonary nodules on the CT scan.   Incidental CT finding: Coronary artery calcification and aortic  atherosclerotic calcification.   ABDOMEN/PELVIS   Prostate: Intense focus of radiotracer activity in the anterior  RIGHT lobe of the prostate gland with SUV max equal 17.8 (image  206). This corresponds to suspicious lesion on comparison MRI which  was targeted.   Seminal vesicles normal.   Lymph nodes: No abnormal  radiotracer accumulation within pelvic or  abdominal nodes.   Liver: No evidence of liver metastasis.   Incidental CT finding: None.   SKELETON   No focal activity to suggest skeletal metastasis. No sclerotic or  lytic lesions on CT imaging.   IMPRESSION:  1. Intense radiotracer activity in the anterior RIGHT lobe of the  prostate gland consistent with primary prostate adenocarcinoma.  Corresponding targeted finding on prostate MRI.  2. No evidence of metastatic adenopathy in the pelvis or periaortic  retroperitoneum.  3. No evidence of visceral metastasis or skeletal metastasis.  4. Aortic Atherosclerosis (ICD10-I70.0).    Electronically Signed  By: Genevive Bi M.D.  On: 12/14/2022 16:06   PROCEDURES: None   ASSESSMENT:      ICD-10 Details  1 GU:   Prostate Cancer - C61    PLAN:           Document Letter(s):  Created for Patient: Clinical Summary         Notes:   1. Unfavorable intermediate risk prostate cancer: We reviewed his PSMA PET scan that does not demonstrate evidence of metastatic disease. Based on the progression of his cancer that was upgraded on his most recent biopsy, we reviewed options for treatment. The patient was counseled about the natural history of prostate cancer and the standard treatment options that are available for prostate cancer. It was explained to him how his age and life expectancy, clinical stage, Gleason score/prognostic grade group, and PSA (and PSA density) affect his prognosis, the decision to proceed with additional staging studies, as well as how that information influences recommended treatment strategies. We discussed the roles for active surveillance, radiation therapy, surgical therapy, androgen deprivation, as well as ablative therapy and other investigational options for the treatment of prostate cancer as appropriate to his individual cancer situation. We discussed the risks and benefits of these options with regard to their  impact on cancer control and also in terms of potential adverse events, complications, and impact on quality of life particularly related to urinary and sexual function. The patient was encouraged to ask questions throughout the discussion today and all questions were answered to his stated satisfaction. In addition, the patient was provided with and/or directed to appropriate resources and literature for further education about prostate cancer and treatment options.   At this time, he is most interested in proceeding with IMRT for definitive therapy after his prior discussion with Dr. Kathrynn Running. Considering his relatively low volume disease, Dr.  Manny did not feel that he would require short-term androgen deprivation therapy and I do agree with him. He will be scheduled for fiducial marker placement and SpaceOAR hydrogel insertion with plans to begin treatment in the near future. I will schedule him for ongoing PSA surveillance following his treatment.   CC: Dr. Eric Form  Dr. Margaretmary Dys      E & M CODES: We spent 32 minutes dedicated to evaluation and management time, including face to face interaction, discussions on coordination of care, documentation, result review, and discussion with others as applicable.     * Signed by Heloise Purpura, M.D. on 12/15/22 at 7:39 PM (EDT)*

## 2023-01-04 ENCOUNTER — Encounter (HOSPITAL_BASED_OUTPATIENT_CLINIC_OR_DEPARTMENT_OTHER)
Admission: RE | Disposition: A | Discharge status: Home / Self Care | Payer: Self-pay | Source: Home / Self Care | Attending: Urology

## 2023-01-04 ENCOUNTER — Other Ambulatory Visit: Payer: Self-pay

## 2023-01-04 ENCOUNTER — Ambulatory Visit (HOSPITAL_BASED_OUTPATIENT_CLINIC_OR_DEPARTMENT_OTHER): Payer: HMO | Admitting: Anesthesiology

## 2023-01-04 ENCOUNTER — Encounter (HOSPITAL_BASED_OUTPATIENT_CLINIC_OR_DEPARTMENT_OTHER): Payer: Self-pay | Admitting: Urology

## 2023-01-04 ENCOUNTER — Ambulatory Visit (HOSPITAL_BASED_OUTPATIENT_CLINIC_OR_DEPARTMENT_OTHER)
Admission: RE | Admit: 2023-01-04 | Discharge: 2023-01-04 | Disposition: A | Discharge status: Home / Self Care | Payer: HMO | Attending: Urology | Admitting: Urology

## 2023-01-04 DIAGNOSIS — Z87891 Personal history of nicotine dependence: Secondary | ICD-10-CM | POA: Insufficient documentation

## 2023-01-04 DIAGNOSIS — Z807 Family history of other malignant neoplasms of lymphoid, hematopoietic and related tissues: Secondary | ICD-10-CM | POA: Insufficient documentation

## 2023-01-04 DIAGNOSIS — Z01818 Encounter for other preprocedural examination: Secondary | ICD-10-CM

## 2023-01-04 DIAGNOSIS — Z801 Family history of malignant neoplasm of trachea, bronchus and lung: Secondary | ICD-10-CM | POA: Insufficient documentation

## 2023-01-04 DIAGNOSIS — C61 Malignant neoplasm of prostate: Secondary | ICD-10-CM | POA: Insufficient documentation

## 2023-01-04 HISTORY — DX: Malignant neoplasm of prostate: C61

## 2023-01-04 HISTORY — PX: SPACE OAR INSTILLATION: SHX6769

## 2023-01-04 HISTORY — DX: Nonrheumatic mitral (valve) prolapse: I34.1

## 2023-01-04 HISTORY — PX: GOLD SEED IMPLANT: SHX6343

## 2023-01-04 LAB — POCT I-STAT, CHEM 8
BUN: 10 mg/dL (ref 8–23)
Calcium, Ion: 1.22 mmol/L (ref 1.15–1.40)
Chloride: 106 mmol/L (ref 98–111)
Creatinine, Ser: 0.7 mg/dL (ref 0.61–1.24)
Glucose, Bld: 101 mg/dL — ABNORMAL HIGH (ref 70–99)
HCT: 44 % (ref 39.0–52.0)
Hemoglobin: 15 g/dL (ref 13.0–17.0)
Potassium: 3.9 mmol/L (ref 3.5–5.1)
Sodium: 142 mmol/L (ref 135–145)
TCO2: 25 mmol/L (ref 22–32)

## 2023-01-04 SURGERY — INSERTION, GOLD SEEDS
Anesthesia: Monitor Anesthesia Care | Site: Prostate

## 2023-01-04 MED ORDER — FENTANYL CITRATE (PF) 100 MCG/2ML IJ SOLN
INTRAMUSCULAR | Status: DC | PRN
  Administered 2023-01-04 (×2): 25 ug via INTRAVENOUS

## 2023-01-04 MED ORDER — PROPOFOL 500 MG/50ML IV EMUL
INTRAVENOUS | Status: DC | PRN
  Administered 2023-01-04: 200 ug/kg/min via INTRAVENOUS

## 2023-01-04 MED ORDER — HYDROMORPHONE HCL 1 MG/ML IJ SOLN: 0.2500 mg | INTRAMUSCULAR | Status: DC | PRN

## 2023-01-04 MED ORDER — ONDANSETRON HCL 4 MG/2ML IJ SOLN
INTRAMUSCULAR | Status: DC | PRN
  Administered 2023-01-04: 4 mg via INTRAVENOUS

## 2023-01-04 MED ORDER — MIDAZOLAM HCL 2 MG/2ML IJ SOLN
INTRAMUSCULAR | Status: AC
  Filled 2023-01-04: qty 2

## 2023-01-04 MED ORDER — PROMETHAZINE HCL 25 MG/ML IJ SOLN: 6.2500 mg | INTRAMUSCULAR | Status: DC | PRN

## 2023-01-04 MED ORDER — SODIUM CHLORIDE (PF) 0.9 % IJ SOLN
INTRAMUSCULAR | Status: DC | PRN
  Administered 2023-01-04: 10 mL

## 2023-01-04 MED ORDER — LACTATED RINGERS IV SOLN: INTRAVENOUS | Status: DC

## 2023-01-04 MED ORDER — OXYCODONE HCL 5 MG PO TABS: 5.0000 mg | ORAL_TABLET | Freq: Once | ORAL | Status: DC | PRN

## 2023-01-04 MED ORDER — MIDAZOLAM HCL 2 MG/2ML IJ SOLN
INTRAMUSCULAR | Status: DC | PRN
  Administered 2023-01-04: 1 mg via INTRAVENOUS

## 2023-01-04 MED ORDER — BUPIVACAINE HCL 0.25 % IJ SOLN
INTRAMUSCULAR | Status: DC | PRN
  Administered 2023-01-04: 10 mL

## 2023-01-04 MED ORDER — OXYCODONE HCL 5 MG/5ML PO SOLN: 5.0000 mg | Freq: Once | ORAL | Status: DC | PRN

## 2023-01-04 MED ORDER — CEFAZOLIN SODIUM-DEXTROSE 2-4 GM/100ML-% IV SOLN
2.0000 g | INTRAVENOUS | Status: AC
  Administered 2023-01-04: 2 g via INTRAVENOUS

## 2023-01-04 MED ORDER — CEFAZOLIN SODIUM-DEXTROSE 2-4 GM/100ML-% IV SOLN
INTRAVENOUS | Status: AC
  Filled 2023-01-04: qty 100

## 2023-01-04 MED ORDER — FENTANYL CITRATE (PF) 100 MCG/2ML IJ SOLN
INTRAMUSCULAR | Status: AC
  Filled 2023-01-04: qty 2

## 2023-01-04 MED ORDER — PROPOFOL 10 MG/ML IV BOLUS
INTRAVENOUS | Status: AC
  Filled 2023-01-04: qty 20

## 2023-01-04 MED ORDER — LIDOCAINE HCL (CARDIAC) PF 100 MG/5ML IV SOSY
PREFILLED_SYRINGE | INTRAVENOUS | Status: DC | PRN
  Administered 2023-01-04: 50 mg via INTRAVENOUS

## 2023-01-04 SURGICAL SUPPLY — 26 items
BLADE CLIPPER SENSICLIP SURGIC (BLADE) ×2 IMPLANT
CNTNR URN SCR LID CUP LEK RST (MISCELLANEOUS) ×2 IMPLANT
CONT SPEC 4OZ STRL OR WHT (MISCELLANEOUS) ×1
COVER BACK TABLE 60X90IN (DRAPES) ×2 IMPLANT
DRAPE C-ARM 35X43 STRL (DRAPES) ×2 IMPLANT
DRSG TEGADERM 4X4.75 (GAUZE/BANDAGES/DRESSINGS) ×2 IMPLANT
DRSG TEGADERM 8X12 (GAUZE/BANDAGES/DRESSINGS) ×2 IMPLANT
GAUZE SPONGE 4X4 12PLY STRL (GAUZE/BANDAGES/DRESSINGS) ×2 IMPLANT
GLOVE BIO SURGEON STRL SZ7.5 (GLOVE) ×2 IMPLANT
GLOVE SURG ORTHO 8.5 STRL (GLOVE) ×2 IMPLANT
IMPL SPACEOAR VUE SYSTEM (Spacer) ×2 IMPLANT
IMPLANT SPACEOAR VUE SYSTEM (Spacer) ×1 IMPLANT
KIT TURNOVER CYSTO (KITS) ×2 IMPLANT
MARKER GOLD PRELOAD 1.2X3 (Urological Implant) ×2 IMPLANT
MARKER SKIN DUAL TIP RULER LAB (MISCELLANEOUS) ×2 IMPLANT
NDL SPNL 22GX3.5 QUINCKE BK (NEEDLE) ×2 IMPLANT
NEEDLE SPNL 22GX3.5 QUINCKE BK (NEEDLE) ×1 IMPLANT
SEED GOLD PRELOAD 1.2X3 (Urological Implant) ×1 IMPLANT
SHEATH ULTRASOUND LF (SHEATH) IMPLANT
SHEATH ULTRASOUND LTX NONSTRL (SHEATH) IMPLANT
SLEEVE SCD COMPRESS KNEE MED (STOCKING) ×2 IMPLANT
SURGILUBE 2OZ TUBE FLIPTOP (MISCELLANEOUS) ×2 IMPLANT
SYR 10ML LL (SYRINGE) ×2 IMPLANT
SYR CONTROL 10ML LL (SYRINGE) ×2 IMPLANT
TOWEL OR 17X24 6PK STRL BLUE (TOWEL DISPOSABLE) ×2 IMPLANT
UNDERPAD 30X36 HEAVY ABSORB (UNDERPADS AND DIAPERS) ×2 IMPLANT

## 2023-01-04 NOTE — Op Note (Signed)
Preoperative diagnosis: Prostate cancer   Postoperative diagnosis: Prostate cancer   Procedure:  1) Fiducial marker placement into the prostate 2) Insertion of SpaceOAR hydrogel    Surgeon: Romie Tay S. Quandre Polinski, Jr. M.D.   Anesthesia: IV sedation, Local anesthesia   EBL: Minimal   Complications: None   Indication: The potential risks, complications, alternative options, and expected recovery course associated with the above procedure(s) have been discussed in detail with the patient and he has provided informed consent to proceed.   Description of procedure: The patient was administered preoperative antibiotics, placed in the dorsal lithotomy position, and prepped and draped in the usual sterile fashion. A preoperative time out was performed.  Next, transrectal ultrasonography was utilized to visualize the prostate. 10 cc of 0.25% bupivacaine was then used to infiltrate the subcuateous tissue of the perineum and an additional 10 cc was injected into the lateral apical tissue surrounding the prostate for a periprostatic nerve block.  Three gold fiducial markers were then placed into the prostate via transperineal needles under ultrasound guidance at the right apex, right base, and left mid gland under direct ultrasound guidance.  A site in the midline was then selected on the perineum for placement of an 18 g needle with saline.  The needle was advanced above the rectum and below Denonvillier's fascia to the mid gland and confirmed to be in the midline on transverse imaging.  One cc of saline was injected confirming appropriate expansion of this space.  A total of 5-10 cc of saline was then injected to open the space further bilaterally.  The saline syringe was then removed and the SpaceOAR hydrogel was injected with good distribution bilaterally. He tolerated the procedure well and without complications. He was able to be awakened and transferred to the PACU in stable condition.  

## 2023-01-04 NOTE — Transfer of Care (Signed)
Immediate Anesthesia Transfer of Care Note  Patient: Tyler Waller  Procedure(s) Performed: GOLD SEED IMPLANT (Prostate) SPACE OAR INSTILLATION (Prostate)  Patient Location: PACU  Anesthesia Type:MAC  Level of Consciousness: awake, alert , oriented, and patient cooperative  Airway & Oxygen Therapy: Patient Spontanous Breathing  Post-op Assessment: Report given to RN and Post -op Vital signs reviewed and stable  Post vital signs: Reviewed and stable  Last Vitals:  Vitals Value Taken Time  BP    Temp    Pulse 65 01/04/23 0751  Resp 16 01/04/23 0751  SpO2 100 % 01/04/23 0751  Vitals shown include unvalidated device data.  Last Pain:  Vitals:   01/04/23 0535  TempSrc: Oral      Patients Stated Pain Goal: 8 (01/04/23 0559)  Complications: No notable events documented.

## 2023-01-04 NOTE — Discharge Instructions (Addendum)
You should avoid strenuous activities today but may resume all normal activities tomorrow.  2.   You can take Tylenol as needed for any pain or discomfort.  3.    Follow up with your radiation oncologist for your simulation appointment as scheduled.  If this is not currently scheduled or you do not know the date/time for that appointment, please contact the radiation oncology office to confirm.    Post Anesthesia Home Care Instructions  Activity: Get plenty of rest for the remainder of the day. A responsible individual must stay with you for 24 hours following the procedure.  For the next 24 hours, DO NOT: -Drive a car -Operate machinery -Drink alcoholic beverages -Take any medication unless instructed by your physician -Make any legal decisions or sign important papers.  Meals: Start with liquid foods such as gelatin or soup. Progress to regular foods as tolerated. Avoid greasy, spicy, heavy foods. If nausea and/or vomiting occur, drink only clear liquids until the nausea and/or vomiting subsides. Call your physician if vomiting continues.  Special Instructions/Symptoms: Your throat may feel dry or sore from the anesthesia or the breathing tube placed in your throat during surgery. If this causes discomfort, gargle with warm salt water. The discomfort should disappear within 24 hours.  

## 2023-01-04 NOTE — Anesthesia Preprocedure Evaluation (Addendum)
Anesthesia Evaluation  Patient identified by MRN, date of birth, ID band Patient awake    Reviewed: Allergy & Precautions, H&P , NPO status , Patient's Chart, lab work & pertinent test results  Airway Mallampati: II  TM Distance: >3 FB Neck ROM: Full    Dental no notable dental hx.    Pulmonary former smoker   Pulmonary exam normal breath sounds clear to auscultation       Cardiovascular Normal cardiovascular exam(-) dysrhythmias  Rhythm:Regular Rate:Normal     Neuro/Psych negative neurological ROS  negative psych ROS   GI/Hepatic Neg liver ROS,GERD  ,,  Endo/Other  negative endocrine ROS    Renal/GU negative Renal ROS  negative genitourinary   Musculoskeletal negative musculoskeletal ROS (+)    Abdominal   Peds negative pediatric ROS (+)  Hematology negative hematology ROS (+)   Anesthesia Other Findings Prostate Cancer  Reproductive/Obstetrics negative OB ROS                             Anesthesia Physical Anesthesia Plan  ASA: 3  Anesthesia Plan: MAC   Post-op Pain Management: Minimal or no pain anticipated   Induction: Intravenous  PONV Risk Score and Plan: 1 and Ondansetron and Treatment may vary due to age or medical condition  Airway Management Planned: Simple Face Mask  Additional Equipment:   Intra-op Plan:   Post-operative Plan:   Informed Consent: I have reviewed the patients History and Physical, chart, labs and discussed the procedure including the risks, benefits and alternatives for the proposed anesthesia with the patient or authorized representative who has indicated his/her understanding and acceptance.     Dental advisory given  Plan Discussed with: CRNA  Anesthesia Plan Comments:        Anesthesia Quick Evaluation

## 2023-01-04 NOTE — Anesthesia Postprocedure Evaluation (Signed)
Anesthesia Post Note  Patient: Tyler Waller  Procedure(s) Performed: GOLD SEED IMPLANT (Prostate) SPACE OAR INSTILLATION (Prostate)     Patient location during evaluation: PACU Anesthesia Type: MAC Level of consciousness: awake and alert Pain management: pain level controlled Vital Signs Assessment: post-procedure vital signs reviewed and stable Respiratory status: spontaneous breathing, nonlabored ventilation and respiratory function stable Cardiovascular status: blood pressure returned to baseline and stable Postop Assessment: no apparent nausea or vomiting Anesthetic complications: no   No notable events documented.  Last Vitals:  Vitals:   01/04/23 0800 01/04/23 0815  BP: 136/77 (!) 147/87  Pulse: 70 83  Resp: 16 16  Temp:  36.6 C  SpO2: 99% 98%    Last Pain:  Vitals:   01/04/23 0815  TempSrc:   PainSc: 0-No pain                 Lowella Curb

## 2023-01-04 NOTE — Interval H&P Note (Signed)
History and Physical Interval Note:  01/04/2023 7:01 AM  Tyler Waller  has presented today for surgery, with the diagnosis of PROSTATE CANCER.  The various methods of treatment have been discussed with the patient and family. After consideration of risks, benefits and other options for treatment, the patient has consented to  Procedure(s) with comments: GOLD SEED IMPLANT (N/A) - 30 MINUTES NEEDED FOR CASE SPACE OAR INSTILLATION (N/A) as a surgical intervention.  The patient's history has been reviewed, patient examined, no change in status, stable for surgery.  I have reviewed the patient's chart and labs.  Questions were answered to the patient's satisfaction.     [employer]

## 2023-01-08 ENCOUNTER — Encounter (HOSPITAL_BASED_OUTPATIENT_CLINIC_OR_DEPARTMENT_OTHER): Payer: Self-pay | Admitting: Urology

## 2023-01-09 ENCOUNTER — Telehealth: Payer: Self-pay | Admitting: *Deleted

## 2023-01-09 NOTE — Telephone Encounter (Signed)
Called patient to remind of sim appt. For 01-11-23- arrival time- 9:45 am @ [facility], informed patient to arrive with a full bladder, spoke with patient and he is aware of this appt. and the instructions

## 2023-01-10 NOTE — Progress Notes (Signed)
  Radiation Oncology         [phone] ________________________________  Name: Tyler Waller MRN: [id number]  Date: 01/11/2023  DOB: 1945-10-05  SIMULATION AND TREATMENT PLANNING NOTE    ICD-10-CM   1. Malignant neoplasm of prostate (HCC)  C61       DIAGNOSIS:  77 y.o. gentleman with Stage T1c adenocarcinoma of the prostate with Gleason score of 4+3, and PSA of 14.5.   NARRATIVE:  The patient was brought to the CT Simulation planning suite.  Identity was confirmed.  All relevant records and images related to the planned course of therapy were reviewed.  The patient freely provided informed written consent to proceed with treatment after reviewing the details related to the planned course of therapy. The consent form was witnessed and verified by the simulation staff.  Then, the patient was set-up in a stable reproducible supine position for radiation therapy.  A vacuum lock pillow device was custom fabricated to position his legs in a reproducible immobilized position.  Then, I performed a urethrogram under sterile conditions to identify the prostatic apex.  CT images were obtained.  Surface markings were placed.  The CT images were loaded into the planning software.  Then the prostate target and avoidance structures including the rectum, bladder, bowel and hips were contoured.  Treatment planning then occurred.  The radiation prescription was entered and confirmed.  A total of one complex treatment devices was fabricated. I have requested : Intensity Modulated Radiotherapy (IMRT) is medically necessary for this case for the following reason:  Rectal sparing.Marland Kitchen  PLAN:  The patient will receive 70 Gy in 28 fractions.  ________________________________  Artist Pais Kathrynn Running, M.D.

## 2023-01-11 ENCOUNTER — Ambulatory Visit
Admission: RE | Admit: 2023-01-11 | Discharge: 2023-01-11 | Disposition: A | Discharge status: Home / Self Care | Payer: HMO | Source: Ambulatory Visit | Attending: Radiation Oncology | Admitting: Radiation Oncology

## 2023-01-11 ENCOUNTER — Other Ambulatory Visit: Payer: Self-pay

## 2023-01-11 ENCOUNTER — Encounter: Payer: Self-pay | Admitting: Cardiovascular Disease

## 2023-01-11 DIAGNOSIS — Z510 Encounter for antineoplastic radiation therapy: Secondary | ICD-10-CM | POA: Diagnosis not present

## 2023-01-11 DIAGNOSIS — C61 Malignant neoplasm of prostate: Secondary | ICD-10-CM | POA: Diagnosis not present

## 2023-01-11 DIAGNOSIS — Z191 Hormone sensitive malignancy status: Secondary | ICD-10-CM | POA: Diagnosis not present

## 2023-01-11 NOTE — Progress Notes (Signed)
TO BE COMPLETED BY RADIATION ONCOLOGIST OFFICE:   Patient Name: Tyler Waller   Date of Birth: 1945/08/08   Radiation Oncologist:   Site to be Treated:   Will x-rays >10 MV be used? No  Will the radiation be >10 cm from the device? Yes  Planned Treatment Start Date:   TO BE COMPLETED BY CARDIOLOGIST OFFICE:   Device Information:  Loop Recorder   Brand: Medtronic: [phone] Model #: Medtronic [id number] Reveal LINQ  Serial Number: [id number]     Date of Placement: 10/19/2017  Site of Placement: Chest  Remote Device Check--Frequency: N/A   Last Check: 07/20/2021 Battery is no longer working.   Does cardiologist request Radiation Oncology to schedule device testing by vendor for the following:  Prior to the Initiation of Treatments?  Yes []  No [x]  During Treatments?  Yes []  No [x]  Post Radiation Treatments?  Yes []  No [x]   Is device monitoring necessary by vendor/cardiologist team during treatments?  Yes []   No [x]   Is cardiac monitoring by Radiation Oncology nursing necessary during treatments? Yes []   No [x]   Do you recommend device be relocated prior to Radiation Treatment? Yes []   No [x]   **PLEASE LIST ANY NOTES OR SPECIAL REQUESTS:       CARDIOLOGIST SIGNATURE:  Dr. Thurmon Fair Per Device Clinic Standing Orders, Lenor Coffin  01/11/2023 10:47 AM  **Please route completed form back to Radiation Oncology Nursing and "P [facility] RAD ONC ADMIN", OR send an update if there will be a delay in having form completed by expected start date.  **Call [phone] if you have any questions or do not get an in-basket response from a Radiation Oncology staff member

## 2023-01-12 DIAGNOSIS — C61 Malignant neoplasm of prostate: Secondary | ICD-10-CM | POA: Diagnosis not present

## 2023-01-12 DIAGNOSIS — Z510 Encounter for antineoplastic radiation therapy: Secondary | ICD-10-CM | POA: Diagnosis not present

## 2023-01-12 DIAGNOSIS — Z191 Hormone sensitive malignancy status: Secondary | ICD-10-CM | POA: Diagnosis not present

## 2023-01-29 ENCOUNTER — Other Ambulatory Visit: Payer: Self-pay

## 2023-01-29 ENCOUNTER — Ambulatory Visit
Admission: RE | Admit: 2023-01-29 | Discharge: 2023-01-29 | Disposition: A | Discharge status: Home / Self Care | Payer: HMO | Source: Ambulatory Visit | Attending: Radiation Oncology | Admitting: Radiation Oncology

## 2023-01-29 DIAGNOSIS — C61 Malignant neoplasm of prostate: Secondary | ICD-10-CM | POA: Diagnosis not present

## 2023-01-29 DIAGNOSIS — Z510 Encounter for antineoplastic radiation therapy: Secondary | ICD-10-CM | POA: Diagnosis not present

## 2023-01-29 DIAGNOSIS — Z191 Hormone sensitive malignancy status: Secondary | ICD-10-CM | POA: Diagnosis not present

## 2023-01-29 LAB — RAD ONC ARIA SESSION SUMMARY
Course Elapsed Days: 0
Plan Fractions Treated to Date: 1
Plan Prescribed Dose Per Fraction: 2.5 Gy
Plan Total Fractions Prescribed: 28
Plan Total Prescribed Dose: 70 Gy
Reference Point Dosage Given to Date: 2.5 Gy
Reference Point Session Dosage Given: 2.5 Gy
Session Number: 1

## 2023-01-30 ENCOUNTER — Other Ambulatory Visit: Payer: Self-pay

## 2023-01-30 ENCOUNTER — Ambulatory Visit
Admission: RE | Admit: 2023-01-30 | Discharge: 2023-01-30 | Disposition: A | Discharge status: Home / Self Care | Payer: HMO | Source: Ambulatory Visit | Attending: Radiation Oncology | Admitting: Radiation Oncology

## 2023-01-30 DIAGNOSIS — Z510 Encounter for antineoplastic radiation therapy: Secondary | ICD-10-CM | POA: Diagnosis not present

## 2023-01-30 DIAGNOSIS — C61 Malignant neoplasm of prostate: Secondary | ICD-10-CM | POA: Diagnosis not present

## 2023-01-30 DIAGNOSIS — Z191 Hormone sensitive malignancy status: Secondary | ICD-10-CM | POA: Diagnosis not present

## 2023-01-30 LAB — RAD ONC ARIA SESSION SUMMARY
Course Elapsed Days: 1
Plan Fractions Treated to Date: 2
Plan Prescribed Dose Per Fraction: 2.5 Gy
Plan Total Fractions Prescribed: 28
Plan Total Prescribed Dose: 70 Gy
Reference Point Dosage Given to Date: 5 Gy
Reference Point Session Dosage Given: 2.5 Gy
Session Number: 2

## 2023-01-30 NOTE — Progress Notes (Signed)
RN spoke with patient to evaluate any needs or concerns since starting treatment.  No additional needs at this time.

## 2023-01-31 ENCOUNTER — Other Ambulatory Visit: Payer: Self-pay

## 2023-01-31 ENCOUNTER — Ambulatory Visit
Admission: RE | Admit: 2023-01-31 | Discharge: 2023-01-31 | Disposition: A | Discharge status: Home / Self Care | Payer: HMO | Source: Ambulatory Visit | Attending: Radiation Oncology | Admitting: Radiation Oncology

## 2023-01-31 DIAGNOSIS — Z191 Hormone sensitive malignancy status: Secondary | ICD-10-CM | POA: Diagnosis not present

## 2023-01-31 DIAGNOSIS — Z510 Encounter for antineoplastic radiation therapy: Secondary | ICD-10-CM | POA: Diagnosis not present

## 2023-01-31 DIAGNOSIS — C61 Malignant neoplasm of prostate: Secondary | ICD-10-CM | POA: Diagnosis not present

## 2023-01-31 LAB — RAD ONC ARIA SESSION SUMMARY
Course Elapsed Days: 2
Plan Fractions Treated to Date: 3
Plan Prescribed Dose Per Fraction: 2.5 Gy
Plan Total Fractions Prescribed: 28
Plan Total Prescribed Dose: 70 Gy
Reference Point Dosage Given to Date: 7.5 Gy
Reference Point Session Dosage Given: 2.5 Gy
Session Number: 3

## 2023-02-01 ENCOUNTER — Other Ambulatory Visit: Payer: Self-pay

## 2023-02-01 ENCOUNTER — Ambulatory Visit
Admission: RE | Admit: 2023-02-01 | Discharge: 2023-02-01 | Disposition: A | Discharge status: Home / Self Care | Payer: HMO | Source: Ambulatory Visit | Attending: Radiation Oncology | Admitting: Radiation Oncology

## 2023-02-01 DIAGNOSIS — Z510 Encounter for antineoplastic radiation therapy: Secondary | ICD-10-CM | POA: Diagnosis not present

## 2023-02-01 DIAGNOSIS — C61 Malignant neoplasm of prostate: Secondary | ICD-10-CM | POA: Diagnosis not present

## 2023-02-01 DIAGNOSIS — Z191 Hormone sensitive malignancy status: Secondary | ICD-10-CM | POA: Diagnosis not present

## 2023-02-01 LAB — RAD ONC ARIA SESSION SUMMARY
Course Elapsed Days: 3
Plan Fractions Treated to Date: 4
Plan Prescribed Dose Per Fraction: 2.5 Gy
Plan Total Fractions Prescribed: 28
Plan Total Prescribed Dose: 70 Gy
Reference Point Dosage Given to Date: 10 Gy
Reference Point Session Dosage Given: 2.5 Gy
Session Number: 4

## 2023-02-02 ENCOUNTER — Ambulatory Visit
Admission: RE | Admit: 2023-02-02 | Discharge: 2023-02-02 | Disposition: A | Discharge status: Home / Self Care | Payer: HMO | Source: Ambulatory Visit | Attending: Radiation Oncology | Admitting: Radiation Oncology

## 2023-02-02 ENCOUNTER — Other Ambulatory Visit: Payer: Self-pay

## 2023-02-02 DIAGNOSIS — Z510 Encounter for antineoplastic radiation therapy: Secondary | ICD-10-CM | POA: Diagnosis not present

## 2023-02-02 DIAGNOSIS — Z191 Hormone sensitive malignancy status: Secondary | ICD-10-CM | POA: Diagnosis not present

## 2023-02-02 DIAGNOSIS — C61 Malignant neoplasm of prostate: Secondary | ICD-10-CM | POA: Diagnosis not present

## 2023-02-02 LAB — RAD ONC ARIA SESSION SUMMARY
Course Elapsed Days: 4
Plan Fractions Treated to Date: 5
Plan Prescribed Dose Per Fraction: 2.5 Gy
Plan Total Fractions Prescribed: 28
Plan Total Prescribed Dose: 70 Gy
Reference Point Dosage Given to Date: 12.5 Gy
Reference Point Session Dosage Given: 2.5 Gy
Session Number: 5

## 2023-02-05 ENCOUNTER — Other Ambulatory Visit: Payer: Self-pay

## 2023-02-05 ENCOUNTER — Ambulatory Visit
Admission: RE | Admit: 2023-02-05 | Discharge: 2023-02-05 | Disposition: A | Discharge status: Home / Self Care | Payer: HMO | Source: Ambulatory Visit | Attending: Radiation Oncology | Admitting: Radiation Oncology

## 2023-02-05 DIAGNOSIS — Z510 Encounter for antineoplastic radiation therapy: Secondary | ICD-10-CM | POA: Diagnosis not present

## 2023-02-05 DIAGNOSIS — Z191 Hormone sensitive malignancy status: Secondary | ICD-10-CM | POA: Diagnosis not present

## 2023-02-05 DIAGNOSIS — C61 Malignant neoplasm of prostate: Secondary | ICD-10-CM | POA: Insufficient documentation

## 2023-02-05 LAB — RAD ONC ARIA SESSION SUMMARY
Course Elapsed Days: 7
Plan Fractions Treated to Date: 6
Plan Prescribed Dose Per Fraction: 2.5 Gy
Plan Total Fractions Prescribed: 28
Plan Total Prescribed Dose: 70 Gy
Reference Point Dosage Given to Date: 15 Gy
Reference Point Session Dosage Given: 2.5 Gy
Session Number: 6

## 2023-02-06 ENCOUNTER — Other Ambulatory Visit: Payer: Self-pay

## 2023-02-06 ENCOUNTER — Ambulatory Visit
Admission: RE | Admit: 2023-02-06 | Discharge: 2023-02-06 | Disposition: A | Discharge status: Home / Self Care | Payer: HMO | Source: Ambulatory Visit | Attending: Radiation Oncology | Admitting: Radiation Oncology

## 2023-02-06 DIAGNOSIS — C61 Malignant neoplasm of prostate: Secondary | ICD-10-CM | POA: Diagnosis not present

## 2023-02-06 DIAGNOSIS — Z191 Hormone sensitive malignancy status: Secondary | ICD-10-CM | POA: Diagnosis not present

## 2023-02-06 DIAGNOSIS — Z510 Encounter for antineoplastic radiation therapy: Secondary | ICD-10-CM | POA: Diagnosis not present

## 2023-02-06 LAB — RAD ONC ARIA SESSION SUMMARY
Course Elapsed Days: 8
Plan Fractions Treated to Date: 7
Plan Prescribed Dose Per Fraction: 2.5 Gy
Plan Total Fractions Prescribed: 28
Plan Total Prescribed Dose: 70 Gy
Reference Point Dosage Given to Date: 17.5 Gy
Reference Point Session Dosage Given: 2.5 Gy
Session Number: 7

## 2023-02-07 ENCOUNTER — Ambulatory Visit
Admission: RE | Admit: 2023-02-07 | Discharge: 2023-02-07 | Disposition: A | Discharge status: Home / Self Care | Payer: HMO | Source: Ambulatory Visit | Attending: Radiation Oncology | Admitting: Radiation Oncology

## 2023-02-07 ENCOUNTER — Other Ambulatory Visit: Payer: Self-pay

## 2023-02-07 DIAGNOSIS — Z510 Encounter for antineoplastic radiation therapy: Secondary | ICD-10-CM | POA: Diagnosis not present

## 2023-02-07 DIAGNOSIS — C61 Malignant neoplasm of prostate: Secondary | ICD-10-CM | POA: Diagnosis not present

## 2023-02-07 DIAGNOSIS — Z191 Hormone sensitive malignancy status: Secondary | ICD-10-CM | POA: Diagnosis not present

## 2023-02-07 DIAGNOSIS — D225 Melanocytic nevi of trunk: Secondary | ICD-10-CM | POA: Diagnosis not present

## 2023-02-07 DIAGNOSIS — D2261 Melanocytic nevi of right upper limb, including shoulder: Secondary | ICD-10-CM | POA: Diagnosis not present

## 2023-02-07 DIAGNOSIS — L570 Actinic keratosis: Secondary | ICD-10-CM | POA: Diagnosis not present

## 2023-02-07 DIAGNOSIS — L821 Other seborrheic keratosis: Secondary | ICD-10-CM | POA: Diagnosis not present

## 2023-02-07 DIAGNOSIS — D1801 Hemangioma of skin and subcutaneous tissue: Secondary | ICD-10-CM | POA: Diagnosis not present

## 2023-02-07 DIAGNOSIS — Z85828 Personal history of other malignant neoplasm of skin: Secondary | ICD-10-CM | POA: Diagnosis not present

## 2023-02-07 DIAGNOSIS — L738 Other specified follicular disorders: Secondary | ICD-10-CM | POA: Diagnosis not present

## 2023-02-07 DIAGNOSIS — D692 Other nonthrombocytopenic purpura: Secondary | ICD-10-CM | POA: Diagnosis not present

## 2023-02-07 LAB — RAD ONC ARIA SESSION SUMMARY
Course Elapsed Days: 9
Plan Fractions Treated to Date: 8
Plan Prescribed Dose Per Fraction: 2.5 Gy
Plan Total Fractions Prescribed: 28
Plan Total Prescribed Dose: 70 Gy
Reference Point Dosage Given to Date: 20 Gy
Reference Point Session Dosage Given: 2.5 Gy
Session Number: 8

## 2023-02-09 ENCOUNTER — Other Ambulatory Visit: Payer: Self-pay

## 2023-02-09 ENCOUNTER — Ambulatory Visit
Admission: RE | Admit: 2023-02-09 | Discharge: 2023-02-09 | Disposition: A | Discharge status: Home / Self Care | Payer: HMO | Source: Ambulatory Visit | Attending: Radiation Oncology | Admitting: Radiation Oncology

## 2023-02-09 DIAGNOSIS — Z191 Hormone sensitive malignancy status: Secondary | ICD-10-CM | POA: Diagnosis not present

## 2023-02-09 DIAGNOSIS — C61 Malignant neoplasm of prostate: Secondary | ICD-10-CM | POA: Diagnosis not present

## 2023-02-09 DIAGNOSIS — Z510 Encounter for antineoplastic radiation therapy: Secondary | ICD-10-CM | POA: Diagnosis not present

## 2023-02-09 LAB — RAD ONC ARIA SESSION SUMMARY
Course Elapsed Days: 11
Plan Fractions Treated to Date: 9
Plan Prescribed Dose Per Fraction: 2.5 Gy
Plan Total Fractions Prescribed: 28
Plan Total Prescribed Dose: 70 Gy
Reference Point Dosage Given to Date: 22.5 Gy
Reference Point Session Dosage Given: 2.5 Gy
Session Number: 9

## 2023-02-12 ENCOUNTER — Ambulatory Visit
Admission: RE | Admit: 2023-02-12 | Discharge: 2023-02-12 | Disposition: A | Discharge status: Home / Self Care | Payer: HMO | Source: Ambulatory Visit | Attending: Radiation Oncology | Admitting: Radiation Oncology

## 2023-02-12 ENCOUNTER — Other Ambulatory Visit: Payer: Self-pay

## 2023-02-12 DIAGNOSIS — Z510 Encounter for antineoplastic radiation therapy: Secondary | ICD-10-CM | POA: Diagnosis not present

## 2023-02-12 DIAGNOSIS — Z191 Hormone sensitive malignancy status: Secondary | ICD-10-CM | POA: Diagnosis not present

## 2023-02-12 DIAGNOSIS — C61 Malignant neoplasm of prostate: Secondary | ICD-10-CM | POA: Diagnosis not present

## 2023-02-12 LAB — RAD ONC ARIA SESSION SUMMARY
Course Elapsed Days: 14
Plan Fractions Treated to Date: 10
Plan Prescribed Dose Per Fraction: 2.5 Gy
Plan Total Fractions Prescribed: 28
Plan Total Prescribed Dose: 70 Gy
Reference Point Dosage Given to Date: 25 Gy
Reference Point Session Dosage Given: 2.5 Gy
Session Number: 10

## 2023-02-13 ENCOUNTER — Ambulatory Visit
Admission: RE | Admit: 2023-02-13 | Discharge: 2023-02-13 | Disposition: A | Discharge status: Home / Self Care | Payer: HMO | Source: Ambulatory Visit | Attending: Radiation Oncology | Admitting: Radiation Oncology

## 2023-02-13 ENCOUNTER — Other Ambulatory Visit: Payer: Self-pay

## 2023-02-13 DIAGNOSIS — Z191 Hormone sensitive malignancy status: Secondary | ICD-10-CM | POA: Diagnosis not present

## 2023-02-13 DIAGNOSIS — Z510 Encounter for antineoplastic radiation therapy: Secondary | ICD-10-CM | POA: Diagnosis not present

## 2023-02-13 DIAGNOSIS — C61 Malignant neoplasm of prostate: Secondary | ICD-10-CM | POA: Diagnosis not present

## 2023-02-13 LAB — RAD ONC ARIA SESSION SUMMARY
Course Elapsed Days: 15
Plan Fractions Treated to Date: 11
Plan Prescribed Dose Per Fraction: 2.5 Gy
Plan Total Fractions Prescribed: 28
Plan Total Prescribed Dose: 70 Gy
Reference Point Dosage Given to Date: 27.5 Gy
Reference Point Session Dosage Given: 2.5 Gy
Session Number: 11

## 2023-02-14 ENCOUNTER — Ambulatory Visit
Admission: RE | Admit: 2023-02-14 | Discharge: 2023-02-14 | Disposition: A | Discharge status: Home / Self Care | Payer: HMO | Source: Ambulatory Visit | Attending: Radiation Oncology | Admitting: Radiation Oncology

## 2023-02-14 ENCOUNTER — Other Ambulatory Visit: Payer: Self-pay

## 2023-02-14 DIAGNOSIS — Z191 Hormone sensitive malignancy status: Secondary | ICD-10-CM | POA: Diagnosis not present

## 2023-02-14 DIAGNOSIS — C61 Malignant neoplasm of prostate: Secondary | ICD-10-CM | POA: Diagnosis not present

## 2023-02-14 DIAGNOSIS — Z510 Encounter for antineoplastic radiation therapy: Secondary | ICD-10-CM | POA: Diagnosis not present

## 2023-02-14 LAB — RAD ONC ARIA SESSION SUMMARY
Course Elapsed Days: 16
Plan Fractions Treated to Date: 12
Plan Prescribed Dose Per Fraction: 2.5 Gy
Plan Total Fractions Prescribed: 28
Plan Total Prescribed Dose: 70 Gy
Reference Point Dosage Given to Date: 30 Gy
Reference Point Session Dosage Given: 2.5 Gy
Session Number: 12

## 2023-02-15 ENCOUNTER — Other Ambulatory Visit: Payer: Self-pay

## 2023-02-15 ENCOUNTER — Ambulatory Visit
Admission: RE | Admit: 2023-02-15 | Discharge: 2023-02-15 | Disposition: A | Discharge status: Home / Self Care | Payer: HMO | Source: Ambulatory Visit | Attending: Radiation Oncology | Admitting: Radiation Oncology

## 2023-02-15 DIAGNOSIS — Z510 Encounter for antineoplastic radiation therapy: Secondary | ICD-10-CM | POA: Diagnosis not present

## 2023-02-15 DIAGNOSIS — Z191 Hormone sensitive malignancy status: Secondary | ICD-10-CM | POA: Diagnosis not present

## 2023-02-15 DIAGNOSIS — C61 Malignant neoplasm of prostate: Secondary | ICD-10-CM | POA: Diagnosis not present

## 2023-02-15 LAB — RAD ONC ARIA SESSION SUMMARY
Course Elapsed Days: 17
Plan Fractions Treated to Date: 13
Plan Prescribed Dose Per Fraction: 2.5 Gy
Plan Total Fractions Prescribed: 28
Plan Total Prescribed Dose: 70 Gy
Reference Point Dosage Given to Date: 32.5 Gy
Reference Point Session Dosage Given: 2.5 Gy
Session Number: 13

## 2023-02-16 ENCOUNTER — Other Ambulatory Visit: Payer: Self-pay

## 2023-02-16 ENCOUNTER — Ambulatory Visit
Admission: RE | Admit: 2023-02-16 | Discharge: 2023-02-16 | Disposition: A | Discharge status: Home / Self Care | Payer: HMO | Source: Ambulatory Visit | Attending: Radiation Oncology | Admitting: Radiation Oncology

## 2023-02-16 ENCOUNTER — Ambulatory Visit: Admission: RE | Admit: 2023-02-16 | Payer: HMO | Source: Ambulatory Visit

## 2023-02-16 DIAGNOSIS — C61 Malignant neoplasm of prostate: Secondary | ICD-10-CM | POA: Diagnosis not present

## 2023-02-16 DIAGNOSIS — Z510 Encounter for antineoplastic radiation therapy: Secondary | ICD-10-CM | POA: Diagnosis not present

## 2023-02-16 DIAGNOSIS — Z191 Hormone sensitive malignancy status: Secondary | ICD-10-CM | POA: Diagnosis not present

## 2023-02-16 LAB — RAD ONC ARIA SESSION SUMMARY
Course Elapsed Days: 18
Plan Fractions Treated to Date: 14
Plan Prescribed Dose Per Fraction: 2.5 Gy
Plan Total Fractions Prescribed: 28
Plan Total Prescribed Dose: 70 Gy
Reference Point Dosage Given to Date: 35 Gy
Reference Point Session Dosage Given: 2.5 Gy
Session Number: 14

## 2023-02-19 ENCOUNTER — Ambulatory Visit
Admission: RE | Admit: 2023-02-19 | Discharge: 2023-02-19 | Disposition: A | Discharge status: Home / Self Care | Payer: HMO | Source: Ambulatory Visit | Attending: Radiation Oncology | Admitting: Radiation Oncology

## 2023-02-19 ENCOUNTER — Other Ambulatory Visit: Payer: Self-pay

## 2023-02-19 DIAGNOSIS — Z191 Hormone sensitive malignancy status: Secondary | ICD-10-CM | POA: Diagnosis not present

## 2023-02-19 DIAGNOSIS — Z510 Encounter for antineoplastic radiation therapy: Secondary | ICD-10-CM | POA: Diagnosis not present

## 2023-02-19 DIAGNOSIS — C61 Malignant neoplasm of prostate: Secondary | ICD-10-CM | POA: Diagnosis not present

## 2023-02-19 LAB — RAD ONC ARIA SESSION SUMMARY
Course Elapsed Days: 21
Plan Fractions Treated to Date: 15
Plan Prescribed Dose Per Fraction: 2.5 Gy
Plan Total Fractions Prescribed: 28
Plan Total Prescribed Dose: 70 Gy
Reference Point Dosage Given to Date: 37.5 Gy
Reference Point Session Dosage Given: 2.5 Gy
Session Number: 15

## 2023-02-20 ENCOUNTER — Ambulatory Visit
Admission: RE | Admit: 2023-02-20 | Discharge: 2023-02-20 | Disposition: A | Discharge status: Home / Self Care | Payer: HMO | Source: Ambulatory Visit | Attending: Radiation Oncology | Admitting: Radiation Oncology

## 2023-02-20 ENCOUNTER — Other Ambulatory Visit: Payer: Self-pay

## 2023-02-20 DIAGNOSIS — C61 Malignant neoplasm of prostate: Secondary | ICD-10-CM | POA: Diagnosis not present

## 2023-02-20 DIAGNOSIS — Z191 Hormone sensitive malignancy status: Secondary | ICD-10-CM | POA: Diagnosis not present

## 2023-02-20 DIAGNOSIS — Z510 Encounter for antineoplastic radiation therapy: Secondary | ICD-10-CM | POA: Diagnosis not present

## 2023-02-20 LAB — RAD ONC ARIA SESSION SUMMARY
Course Elapsed Days: 22
Plan Fractions Treated to Date: 16
Plan Prescribed Dose Per Fraction: 2.5 Gy
Plan Total Fractions Prescribed: 28
Plan Total Prescribed Dose: 70 Gy
Reference Point Dosage Given to Date: 40 Gy
Reference Point Session Dosage Given: 2.5 Gy
Session Number: 16

## 2023-02-21 ENCOUNTER — Other Ambulatory Visit: Payer: Self-pay

## 2023-02-21 ENCOUNTER — Ambulatory Visit
Admission: RE | Admit: 2023-02-21 | Discharge: 2023-02-21 | Disposition: A | Discharge status: Home / Self Care | Payer: HMO | Source: Ambulatory Visit | Attending: Radiation Oncology | Admitting: Radiation Oncology

## 2023-02-21 DIAGNOSIS — Z191 Hormone sensitive malignancy status: Secondary | ICD-10-CM | POA: Diagnosis not present

## 2023-02-21 DIAGNOSIS — Z510 Encounter for antineoplastic radiation therapy: Secondary | ICD-10-CM | POA: Diagnosis not present

## 2023-02-21 DIAGNOSIS — C61 Malignant neoplasm of prostate: Secondary | ICD-10-CM | POA: Diagnosis not present

## 2023-02-21 LAB — RAD ONC ARIA SESSION SUMMARY
Course Elapsed Days: 23
Plan Fractions Treated to Date: 17
Plan Prescribed Dose Per Fraction: 2.5 Gy
Plan Total Fractions Prescribed: 28
Plan Total Prescribed Dose: 70 Gy
Reference Point Dosage Given to Date: 42.5 Gy
Reference Point Session Dosage Given: 2.5 Gy
Session Number: 17

## 2023-02-22 ENCOUNTER — Ambulatory Visit
Admission: RE | Admit: 2023-02-22 | Discharge: 2023-02-22 | Disposition: A | Discharge status: Home / Self Care | Payer: HMO | Source: Ambulatory Visit | Attending: Radiation Oncology | Admitting: Radiation Oncology

## 2023-02-22 ENCOUNTER — Other Ambulatory Visit: Payer: Self-pay

## 2023-02-22 DIAGNOSIS — Z191 Hormone sensitive malignancy status: Secondary | ICD-10-CM | POA: Diagnosis not present

## 2023-02-22 DIAGNOSIS — C61 Malignant neoplasm of prostate: Secondary | ICD-10-CM | POA: Diagnosis not present

## 2023-02-22 DIAGNOSIS — Z510 Encounter for antineoplastic radiation therapy: Secondary | ICD-10-CM | POA: Diagnosis not present

## 2023-02-22 LAB — RAD ONC ARIA SESSION SUMMARY
Course Elapsed Days: 24
Plan Fractions Treated to Date: 18
Plan Prescribed Dose Per Fraction: 2.5 Gy
Plan Total Fractions Prescribed: 28
Plan Total Prescribed Dose: 70 Gy
Reference Point Dosage Given to Date: 45 Gy
Reference Point Session Dosage Given: 2.5 Gy
Session Number: 18

## 2023-02-23 ENCOUNTER — Ambulatory Visit: Admission: RE | Admit: 2023-02-23 | Payer: HMO | Source: Ambulatory Visit

## 2023-02-23 ENCOUNTER — Ambulatory Visit: Payer: HMO

## 2023-02-26 ENCOUNTER — Other Ambulatory Visit: Payer: Self-pay

## 2023-02-26 ENCOUNTER — Ambulatory Visit
Admission: RE | Admit: 2023-02-26 | Discharge: 2023-02-26 | Disposition: A | Discharge status: Home / Self Care | Payer: HMO | Source: Ambulatory Visit | Attending: Radiation Oncology | Admitting: Radiation Oncology

## 2023-02-26 DIAGNOSIS — Z510 Encounter for antineoplastic radiation therapy: Secondary | ICD-10-CM | POA: Diagnosis not present

## 2023-02-26 DIAGNOSIS — C61 Malignant neoplasm of prostate: Secondary | ICD-10-CM | POA: Diagnosis not present

## 2023-02-26 DIAGNOSIS — Z191 Hormone sensitive malignancy status: Secondary | ICD-10-CM | POA: Diagnosis not present

## 2023-02-26 LAB — RAD ONC ARIA SESSION SUMMARY
Course Elapsed Days: 28
Plan Fractions Treated to Date: 19
Plan Prescribed Dose Per Fraction: 2.5 Gy
Plan Total Fractions Prescribed: 28
Plan Total Prescribed Dose: 70 Gy
Reference Point Dosage Given to Date: 47.5 Gy
Reference Point Session Dosage Given: 2.5 Gy
Session Number: 19

## 2023-02-27 ENCOUNTER — Ambulatory Visit: Payer: HMO

## 2023-02-27 ENCOUNTER — Ambulatory Visit
Admission: RE | Admit: 2023-02-27 | Discharge: 2023-02-27 | Disposition: A | Discharge status: Home / Self Care | Payer: HMO | Source: Ambulatory Visit | Attending: Radiation Oncology | Admitting: Radiation Oncology

## 2023-02-27 ENCOUNTER — Other Ambulatory Visit: Payer: Self-pay

## 2023-02-27 DIAGNOSIS — Z191 Hormone sensitive malignancy status: Secondary | ICD-10-CM | POA: Diagnosis not present

## 2023-02-27 DIAGNOSIS — C61 Malignant neoplasm of prostate: Secondary | ICD-10-CM | POA: Diagnosis not present

## 2023-02-27 DIAGNOSIS — Z510 Encounter for antineoplastic radiation therapy: Secondary | ICD-10-CM | POA: Diagnosis not present

## 2023-02-27 LAB — RAD ONC ARIA SESSION SUMMARY
Course Elapsed Days: 29
Plan Fractions Treated to Date: 20
Plan Prescribed Dose Per Fraction: 2.5 Gy
Plan Total Fractions Prescribed: 28
Plan Total Prescribed Dose: 70 Gy
Reference Point Dosage Given to Date: 50 Gy
Reference Point Session Dosage Given: 2.5 Gy
Session Number: 20

## 2023-02-28 ENCOUNTER — Ambulatory Visit
Admission: RE | Admit: 2023-02-28 | Discharge: 2023-02-28 | Disposition: A | Discharge status: Home / Self Care | Payer: HMO | Source: Ambulatory Visit | Attending: Radiation Oncology | Admitting: Radiation Oncology

## 2023-02-28 ENCOUNTER — Other Ambulatory Visit: Payer: Self-pay

## 2023-02-28 DIAGNOSIS — C61 Malignant neoplasm of prostate: Secondary | ICD-10-CM | POA: Diagnosis not present

## 2023-02-28 DIAGNOSIS — H02831 Dermatochalasis of right upper eyelid: Secondary | ICD-10-CM | POA: Diagnosis not present

## 2023-02-28 DIAGNOSIS — H43392 Other vitreous opacities, left eye: Secondary | ICD-10-CM | POA: Diagnosis not present

## 2023-02-28 DIAGNOSIS — H04123 Dry eye syndrome of bilateral lacrimal glands: Secondary | ICD-10-CM | POA: Diagnosis not present

## 2023-02-28 DIAGNOSIS — Z191 Hormone sensitive malignancy status: Secondary | ICD-10-CM | POA: Diagnosis not present

## 2023-02-28 DIAGNOSIS — H43811 Vitreous degeneration, right eye: Secondary | ICD-10-CM | POA: Diagnosis not present

## 2023-02-28 DIAGNOSIS — H02834 Dermatochalasis of left upper eyelid: Secondary | ICD-10-CM | POA: Diagnosis not present

## 2023-02-28 DIAGNOSIS — H2513 Age-related nuclear cataract, bilateral: Secondary | ICD-10-CM | POA: Diagnosis not present

## 2023-02-28 DIAGNOSIS — Z510 Encounter for antineoplastic radiation therapy: Secondary | ICD-10-CM | POA: Diagnosis not present

## 2023-02-28 LAB — RAD ONC ARIA SESSION SUMMARY
Course Elapsed Days: 30
Plan Fractions Treated to Date: 21
Plan Prescribed Dose Per Fraction: 2.5 Gy
Plan Total Fractions Prescribed: 28
Plan Total Prescribed Dose: 70 Gy
Reference Point Dosage Given to Date: 52.5 Gy
Reference Point Session Dosage Given: 2.5 Gy
Session Number: 21

## 2023-03-01 ENCOUNTER — Ambulatory Visit
Admission: RE | Admit: 2023-03-01 | Discharge: 2023-03-01 | Disposition: A | Discharge status: Home / Self Care | Payer: HMO | Source: Ambulatory Visit | Attending: Radiation Oncology | Admitting: Radiation Oncology

## 2023-03-01 ENCOUNTER — Other Ambulatory Visit: Payer: Self-pay

## 2023-03-01 DIAGNOSIS — Z510 Encounter for antineoplastic radiation therapy: Secondary | ICD-10-CM | POA: Diagnosis not present

## 2023-03-01 DIAGNOSIS — C61 Malignant neoplasm of prostate: Secondary | ICD-10-CM | POA: Diagnosis not present

## 2023-03-01 DIAGNOSIS — Z191 Hormone sensitive malignancy status: Secondary | ICD-10-CM | POA: Diagnosis not present

## 2023-03-01 LAB — RAD ONC ARIA SESSION SUMMARY
Course Elapsed Days: 31
Plan Fractions Treated to Date: 22
Plan Prescribed Dose Per Fraction: 2.5 Gy
Plan Total Fractions Prescribed: 28
Plan Total Prescribed Dose: 70 Gy
Reference Point Dosage Given to Date: 55 Gy
Reference Point Session Dosage Given: 2.5 Gy
Session Number: 22

## 2023-03-02 ENCOUNTER — Ambulatory Visit
Admission: RE | Admit: 2023-03-02 | Discharge: 2023-03-02 | Disposition: A | Discharge status: Home / Self Care | Payer: HMO | Source: Ambulatory Visit | Attending: Radiation Oncology | Admitting: Radiation Oncology

## 2023-03-02 ENCOUNTER — Other Ambulatory Visit: Payer: Self-pay

## 2023-03-02 DIAGNOSIS — Z510 Encounter for antineoplastic radiation therapy: Secondary | ICD-10-CM | POA: Diagnosis not present

## 2023-03-02 DIAGNOSIS — C61 Malignant neoplasm of prostate: Secondary | ICD-10-CM | POA: Diagnosis not present

## 2023-03-02 DIAGNOSIS — Z191 Hormone sensitive malignancy status: Secondary | ICD-10-CM | POA: Diagnosis not present

## 2023-03-02 LAB — RAD ONC ARIA SESSION SUMMARY
Course Elapsed Days: 32
Plan Fractions Treated to Date: 23
Plan Prescribed Dose Per Fraction: 2.5 Gy
Plan Total Fractions Prescribed: 28
Plan Total Prescribed Dose: 70 Gy
Reference Point Dosage Given to Date: 57.5 Gy
Reference Point Session Dosage Given: 2.5 Gy
Session Number: 23

## 2023-03-05 ENCOUNTER — Other Ambulatory Visit: Payer: Self-pay

## 2023-03-05 ENCOUNTER — Ambulatory Visit
Admission: RE | Admit: 2023-03-05 | Discharge: 2023-03-05 | Disposition: A | Discharge status: Home / Self Care | Payer: HMO | Source: Ambulatory Visit | Attending: Radiation Oncology | Admitting: Radiation Oncology

## 2023-03-05 DIAGNOSIS — C61 Malignant neoplasm of prostate: Secondary | ICD-10-CM | POA: Diagnosis not present

## 2023-03-05 DIAGNOSIS — Z510 Encounter for antineoplastic radiation therapy: Secondary | ICD-10-CM | POA: Diagnosis not present

## 2023-03-05 DIAGNOSIS — Z191 Hormone sensitive malignancy status: Secondary | ICD-10-CM | POA: Diagnosis not present

## 2023-03-05 LAB — RAD ONC ARIA SESSION SUMMARY
Course Elapsed Days: 35
Plan Fractions Treated to Date: 24
Plan Prescribed Dose Per Fraction: 2.5 Gy
Plan Total Fractions Prescribed: 28
Plan Total Prescribed Dose: 70 Gy
Reference Point Dosage Given to Date: 60 Gy
Reference Point Session Dosage Given: 2.5 Gy
Session Number: 24

## 2023-03-06 ENCOUNTER — Other Ambulatory Visit: Payer: Self-pay

## 2023-03-06 ENCOUNTER — Ambulatory Visit
Admission: RE | Admit: 2023-03-06 | Discharge: 2023-03-06 | Disposition: A | Discharge status: Home / Self Care | Payer: HMO | Source: Ambulatory Visit | Attending: Radiation Oncology | Admitting: Radiation Oncology

## 2023-03-06 DIAGNOSIS — C61 Malignant neoplasm of prostate: Secondary | ICD-10-CM | POA: Diagnosis not present

## 2023-03-06 DIAGNOSIS — Z510 Encounter for antineoplastic radiation therapy: Secondary | ICD-10-CM | POA: Diagnosis not present

## 2023-03-06 DIAGNOSIS — Z191 Hormone sensitive malignancy status: Secondary | ICD-10-CM | POA: Diagnosis not present

## 2023-03-06 LAB — RAD ONC ARIA SESSION SUMMARY
Course Elapsed Days: 36
Plan Fractions Treated to Date: 25
Plan Prescribed Dose Per Fraction: 2.5 Gy
Plan Total Fractions Prescribed: 28
Plan Total Prescribed Dose: 70 Gy
Reference Point Dosage Given to Date: 62.5 Gy
Reference Point Session Dosage Given: 2.5 Gy
Session Number: 25

## 2023-03-07 ENCOUNTER — Other Ambulatory Visit: Payer: Self-pay

## 2023-03-07 ENCOUNTER — Ambulatory Visit
Admission: RE | Admit: 2023-03-07 | Discharge: 2023-03-07 | Disposition: A | Discharge status: Home / Self Care | Payer: HMO | Source: Ambulatory Visit | Attending: Radiation Oncology | Admitting: Radiation Oncology

## 2023-03-07 DIAGNOSIS — C61 Malignant neoplasm of prostate: Secondary | ICD-10-CM | POA: Diagnosis not present

## 2023-03-07 DIAGNOSIS — Z510 Encounter for antineoplastic radiation therapy: Secondary | ICD-10-CM | POA: Diagnosis not present

## 2023-03-07 DIAGNOSIS — Z191 Hormone sensitive malignancy status: Secondary | ICD-10-CM | POA: Diagnosis not present

## 2023-03-07 LAB — RAD ONC ARIA SESSION SUMMARY
Course Elapsed Days: 37
Plan Fractions Treated to Date: 26
Plan Prescribed Dose Per Fraction: 2.5 Gy
Plan Total Fractions Prescribed: 28
Plan Total Prescribed Dose: 70 Gy
Reference Point Dosage Given to Date: 65 Gy
Reference Point Session Dosage Given: 2.5 Gy
Session Number: 26

## 2023-03-08 ENCOUNTER — Ambulatory Visit: Payer: HMO

## 2023-03-08 ENCOUNTER — Other Ambulatory Visit: Payer: Self-pay

## 2023-03-08 ENCOUNTER — Ambulatory Visit
Admission: RE | Admit: 2023-03-08 | Discharge: 2023-03-08 | Disposition: A | Discharge status: Home / Self Care | Payer: HMO | Source: Ambulatory Visit | Attending: Radiation Oncology | Admitting: Radiation Oncology

## 2023-03-08 DIAGNOSIS — C61 Malignant neoplasm of prostate: Secondary | ICD-10-CM | POA: Insufficient documentation

## 2023-03-08 DIAGNOSIS — Z510 Encounter for antineoplastic radiation therapy: Secondary | ICD-10-CM | POA: Diagnosis not present

## 2023-03-08 DIAGNOSIS — Z191 Hormone sensitive malignancy status: Secondary | ICD-10-CM | POA: Diagnosis not present

## 2023-03-08 LAB — RAD ONC ARIA SESSION SUMMARY
Course Elapsed Days: 38
Plan Fractions Treated to Date: 27
Plan Prescribed Dose Per Fraction: 2.5 Gy
Plan Total Fractions Prescribed: 28
Plan Total Prescribed Dose: 70 Gy
Reference Point Dosage Given to Date: 67.5 Gy
Reference Point Session Dosage Given: 2.5 Gy
Session Number: 27

## 2023-03-09 ENCOUNTER — Other Ambulatory Visit: Payer: Self-pay

## 2023-03-09 ENCOUNTER — Ambulatory Visit
Admission: RE | Admit: 2023-03-09 | Discharge: 2023-03-09 | Disposition: A | Discharge status: Home / Self Care | Payer: HMO | Source: Ambulatory Visit | Attending: Radiation Oncology | Admitting: Radiation Oncology

## 2023-03-09 ENCOUNTER — Ambulatory Visit: Admission: RE | Admit: 2023-03-09 | Payer: HMO | Source: Ambulatory Visit

## 2023-03-09 DIAGNOSIS — Z510 Encounter for antineoplastic radiation therapy: Secondary | ICD-10-CM | POA: Diagnosis not present

## 2023-03-09 DIAGNOSIS — C61 Malignant neoplasm of prostate: Secondary | ICD-10-CM | POA: Diagnosis not present

## 2023-03-09 DIAGNOSIS — Z191 Hormone sensitive malignancy status: Secondary | ICD-10-CM | POA: Diagnosis not present

## 2023-03-09 LAB — RAD ONC ARIA SESSION SUMMARY
Course Elapsed Days: 39
Plan Fractions Treated to Date: 28
Plan Prescribed Dose Per Fraction: 2.5 Gy
Plan Total Fractions Prescribed: 28
Plan Total Prescribed Dose: 70 Gy
Reference Point Dosage Given to Date: 70 Gy
Reference Point Session Dosage Given: 2.5 Gy
Session Number: 28

## 2023-03-12 NOTE — Radiation Completion Notes (Addendum)
  Radiation Oncology         [phone] ________________________________  Name: [name] MRN: [id number]  Date: 03/09/2023  DOB: 11/03/1945  Referring Physician: Crecencio Mc, M.D. Date of Service: 2023-03-12 Radiation Oncologist: Margaretmary Bayley, M.D. [name]     RADIATION ONCOLOGY END OF TREATMENT NOTE     Diagnosis:  77 y.o. gentleman with Stage T1c adenocarcinoma of the prostate with Gleason score of 4+3, and PSA of 14.5.   Intent: Curative     ==========DELIVERED PLANS==========  First Treatment Date: 2023-01-29 - Last Treatment Date: 2023-03-09   Plan Name: Prostate Site: Prostate Technique: IMRT Mode: Photon Dose Per Fraction: 2.5 Gy Prescribed Dose (Delivered / Prescribed): 70 Gy / 70 Gy Prescribed Fxs (Delivered / Prescribed): 28 / 28     ==========ON TREATMENT VISIT DATES========== 2023-02-02, 2023-02-07, 2023-02-16, 2023-02-27, 2023-03-02, 2023-03-09   See weekly On Treatment Notes in Epic for details.  He tolerated the radiation treatments relatively well with only mild urinary symptoms and modest fatigue.  He did report mild dysuria and increased nocturia but denied any abdominal pain or bowel issues.  The patient will receive a call in about one month from the radiation oncology department. He will continue follow up with his urologist, Dr. Laverle Patter, as well.  ------------------------------------------------   Margaretmary Dys, MD [facility]  Radiation Oncology Direct Dial: [phone]  Fax: [phone] [email or web address]  Skype  LinkedIn

## 2023-03-13 NOTE — Progress Notes (Signed)
Patient was presented to the [facility] on 12/15/22 for his stage T1c adenocarcinoma of the prostate with Gleason score of 4+3, and PSA of 14.5.  Patient proceed with treatment recommendations of IMRT and had his final radiation treatment on 03/09/23.   Patient is scheduled for a post treatment nurse call on 04/24/23 and has his first post treatment PSA on 09/02/22 at [facility].    RN spoke with patient and provided education on post treatment PSA monitoring.  RN will follow up with [facility] to schedule patient for 3 months post treatment for PSA check.

## 2023-04-13 ENCOUNTER — Other Ambulatory Visit: Payer: Self-pay | Admitting: Urology

## 2023-04-13 DIAGNOSIS — C61 Malignant neoplasm of prostate: Secondary | ICD-10-CM

## 2023-04-13 NOTE — Progress Notes (Signed)
RN spoke with Dr. Vevelyn Royals nurse at [facility] to review change in appointment to have 3 month follow up post treatment.  [facility] RN will review and call back with appointment date and time.    This RN left message with patient updating him on appointment.

## 2023-04-17 NOTE — Progress Notes (Signed)
RN spoke with Tyler Waller, Dr. Vevelyn Royals nurse at [facility], and she was able to change patient's appointment date to 11/5 @ 10:45am.  Patient aware.

## 2023-04-20 NOTE — Progress Notes (Addendum)
Radiation Oncology         [phone]) [phone] ________________________________  Name: Tyler Waller MRN: [id number]  Date of Service: 04/20/2023 DOB: 07-Jan-1946  Post Treatment Telephone Note  Diagnosis:  77 y.o. gentleman with Stage T1c adenocarcinoma of the prostate with Gleason score of 4+3, and PSA of 14.5. (as documented in provider EOT note)  Pre Treatment IPSS Score: 6 (as documented in the provider consult note)  The patient was available for call today.   Symptoms of fatigue have improved since completing therapy.  Symptoms of bladder changes have improved since completing therapy. Current symptoms include none, and medications for bladder symptoms include none.  Symptoms of bowel changes have improved since completing therapy. Current symptoms include none, and medications for bowel symptoms include none.   Post Treatment IPSS Score: IPSS Questionnaire (AUA-7): Over the past month.   1)  How often have you had a sensation of not emptying your bladder completely after you finish urinating?  0 - Not at all  2)  How often have you had to urinate again less than two hours after you finished urinating? 0 - Not at all  3)  How often have you found you stopped and started again several times when you urinated?  2 - Less than half the time  4) How difficult have you found it to postpone urination?  0 - Not at all  5) How often have you had a weak urinary stream?  4 - More than half the time  6) How often have you had to push or strain to begin urination?  0 - Not at all  7) How many times did you most typically get up to urinate from the time you went to bed until the time you got up in the morning?  2 - 2 times  Total score:  8. Which indicates moderate symptoms  0-7 mildly symptomatic   8-19 moderately symptomatic   20-35 severely symptomatic    Patient has a scheduled follow up visit with his urologist, Dr. Laverle Patter, on 06/12/2023 for ongoing surveillance. He was counseled that  PSA levels will be drawn in the urology office, and was reassured that additional time is expected to improve bowel and bladder symptoms. He was encouraged to call back with concerns or questions regarding radiation.   This concludes the interaction.  Ruel Favors, LPN

## 2023-04-24 ENCOUNTER — Ambulatory Visit
Admission: RE | Admit: 2023-04-24 | Discharge: 2023-04-24 | Disposition: A | Discharge status: Home / Self Care | Payer: HMO | Source: Ambulatory Visit | Attending: Radiation Oncology | Admitting: Radiation Oncology

## 2023-04-26 ENCOUNTER — Encounter: Payer: Self-pay | Admitting: *Deleted

## 2023-05-10 ENCOUNTER — Encounter: Payer: Self-pay | Admitting: *Deleted

## 2023-05-10 ENCOUNTER — Inpatient Hospital Stay: Payer: HMO | Attending: Adult Health | Admitting: *Deleted

## 2023-05-10 DIAGNOSIS — C61 Malignant neoplasm of prostate: Secondary | ICD-10-CM

## 2023-05-10 NOTE — Progress Notes (Signed)
SCP reviewed and completed. Pt will see Dr. Laverle Patter for post-radiation visit on Nov.5. Pt will have first post-tx PSA labs on Jan.27, 2025 and will discuss results on Jan.29 with Dr. Laverle Patter.  Pt was made aware of these appts by Survivorship Navigator.

## 2023-06-12 DIAGNOSIS — C61 Malignant neoplasm of prostate: Secondary | ICD-10-CM | POA: Diagnosis not present

## 2023-06-12 DIAGNOSIS — N5201 Erectile dysfunction due to arterial insufficiency: Secondary | ICD-10-CM | POA: Diagnosis not present

## 2023-10-24 DIAGNOSIS — R7301 Impaired fasting glucose: Secondary | ICD-10-CM | POA: Diagnosis not present

## 2023-10-24 DIAGNOSIS — E785 Hyperlipidemia, unspecified: Secondary | ICD-10-CM | POA: Diagnosis not present

## 2023-10-24 DIAGNOSIS — C61 Malignant neoplasm of prostate: Secondary | ICD-10-CM | POA: Diagnosis not present

## 2023-10-24 DIAGNOSIS — R030 Elevated blood-pressure reading, without diagnosis of hypertension: Secondary | ICD-10-CM | POA: Diagnosis not present

## 2023-11-14 DIAGNOSIS — R82998 Other abnormal findings in urine: Secondary | ICD-10-CM | POA: Diagnosis not present

## 2023-11-14 DIAGNOSIS — R030 Elevated blood-pressure reading, without diagnosis of hypertension: Secondary | ICD-10-CM | POA: Diagnosis not present

## 2023-11-14 DIAGNOSIS — H9192 Unspecified hearing loss, left ear: Secondary | ICD-10-CM | POA: Diagnosis not present

## 2023-11-14 DIAGNOSIS — R7301 Impaired fasting glucose: Secondary | ICD-10-CM | POA: Diagnosis not present

## 2023-11-14 DIAGNOSIS — Z1331 Encounter for screening for depression: Secondary | ICD-10-CM | POA: Diagnosis not present

## 2023-11-14 DIAGNOSIS — Z1339 Encounter for screening examination for other mental health and behavioral disorders: Secondary | ICD-10-CM | POA: Diagnosis not present

## 2023-11-14 DIAGNOSIS — Z87898 Personal history of other specified conditions: Secondary | ICD-10-CM | POA: Diagnosis not present

## 2023-11-14 DIAGNOSIS — Z0000 Encounter for general adult medical examination without abnormal findings: Secondary | ICD-10-CM | POA: Diagnosis not present

## 2023-11-14 DIAGNOSIS — Z860101 Personal history of adenomatous and serrated colon polyps: Secondary | ICD-10-CM | POA: Diagnosis not present

## 2023-11-14 DIAGNOSIS — I10 Essential (primary) hypertension: Secondary | ICD-10-CM | POA: Diagnosis not present

## 2023-11-14 DIAGNOSIS — M720 Palmar fascial fibromatosis [Dupuytren]: Secondary | ICD-10-CM | POA: Diagnosis not present

## 2023-11-14 DIAGNOSIS — C61 Malignant neoplasm of prostate: Secondary | ICD-10-CM | POA: Diagnosis not present

## 2023-11-14 DIAGNOSIS — E785 Hyperlipidemia, unspecified: Secondary | ICD-10-CM | POA: Diagnosis not present

## 2023-11-14 DIAGNOSIS — I340 Nonrheumatic mitral (valve) insufficiency: Secondary | ICD-10-CM | POA: Diagnosis not present

## 2024-02-18 ENCOUNTER — Encounter: Payer: Self-pay | Admitting: Cardiovascular Disease

## 2024-02-18 ENCOUNTER — Ambulatory Visit: Attending: Cardiovascular Disease | Admitting: Cardiovascular Disease

## 2024-02-18 VITALS — BP 124/62 | HR 87 | Ht 67.0 in | Wt 133.2 lb

## 2024-02-18 DIAGNOSIS — E7800 Pure hypercholesterolemia, unspecified: Secondary | ICD-10-CM | POA: Diagnosis not present

## 2024-02-18 DIAGNOSIS — Z87898 Personal history of other specified conditions: Secondary | ICD-10-CM

## 2024-02-18 DIAGNOSIS — I341 Nonrheumatic mitral (valve) prolapse: Secondary | ICD-10-CM

## 2024-02-18 NOTE — Progress Notes (Signed)
 Cardiology office note:    Date:  02/18/2024   ID:  Tyler Waller, DOB 1946/05/12, MRN [id number]  PCP:  Tyler Waller., MD  Cardiologist:  Tyler Waller  Referring MD: Tyler Waller., MD   No chief complaint on file.    History of Present Illness:    Tyler Waller is a 78 y.o. male with a hx of an episode of syncope, s/p ILR implantation in March 2019, but no recurrence in the ensuing 4 years of follow-up.  He has a physical exam strongly suggestive of mitral valve prolapse with mitral regurgitation.  He has mild hypercholesterolemia on statin, without known CAD/PAD.  He had a near syncopal event 2 evenings ago, while grilling burgers outside on Saturday evening.  He felt weak, turned white and broke out in a sweat.  Sat down for a couple of minutes and felt better.  Checked his blood pressure about 30 minutes later and was completely normal..  No other cardiovascular complaints. The patient specifically denies any chest pain at rest or with exertion, dyspnea at rest or with exertion, orthopnea, paroxysmal nocturnal dyspnea, syncope, palpitations, focal neurological deficits, intermittent claudication, lower extremity edema, unexplained weight gain, cough, hemoptysis or wheezing.  Lives in a 3 story house and goes up and down the stairs many times without any trouble.  When the weather allows she goes for a walk around the park with his wife on regular basis.  Last year he underwent IMRT for prostate CA with good results and without side effects.  The loop recorder has reached end of service.  His grandchildren in [place],  Tyler Waller  are now ages 45 and 75, respectively.  He thinks he will move to [place] in the next 2 to 3 years to be closer to his family.  Tyler Waller and his wife were taking care of their grandchild when she noticed that he developed rather slow and slurred speech.  He did not have any other lateralizing or focal neurological signs.  As they were walking,  his wife noticed that he stumbled across the steps.  When he entered the house, he lost consciousness and collapsed to the floor and hit his head.  He was picked up by EMS and brought to the emergency room.  He did not fully recover from confusion for about 2 hours.  He did not have loss of sphincter tone did not have any convulsions.  He has no residual sequelae from this event.  He is also completely amnestic for the events.  He does not remember if he had any prodromal symptoms.  While at [facility] and [place] [place] he reportedly underwent both CT and MRI of the head which were normal and an echocardiogram with bubble study which was also reportedly normal.  He had no rhythm abnormalities on telemetry.  He had minimal rhythm abnormalities on the subsequent 30-day event monitor, isolated PVCs.  He had another syncopal event about 20 years ago.  He remembers walking towards the bathroom and falling backwards against his bed, losing consciousness very briefly if at all.  He reports a poorly defined history of seizures when he was only 78 years old.  He does not know any details about this.  He has relatively mild hypercholesterolemia at 219, but with an outstanding HDL cholesterol 127 and LDL of only 84 he does not have hypertension, diabetes mellitus history of smoking or family history of premature coronary artery disease.  His sister did receive a pacemaker  around age of 31.  He has a brother with a known abdominal aortic aneurysm.  His father died of lung cancer in his 35s and his mother died of lymphoma in her late 36s.  He denies knowing that he has any heart problems, but an echocardiogram performed in 2008 showed mitral valve prolapse with trivial mitral regurgitation.  On exam, he actually has a significant holosystolic murmur at the apex, sounds more than trivial.    His electrocardiogram is completely normal: sinus rhythm, normal  QT.  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  Therefore he has a good understanding of medical terminology and pathology.   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 Jakie; hx tubular adenomas 2009, 2006, 2002, 2001   GOLD SEED IMPLANT N/A 01/04/2023   Procedure: GOLD SEED IMPLANT;  Surgeon: Renda Glance, MD;  [facility];  Service: Urology;  Laterality: N/A;  30 MINUTES NEEDED FOR CASE   HAND SURGERY  08/07/1997   right hand Dupreyen's contracture   INGUINAL HERNIA REPAIR     2022   LOOP RECORDER INSERTION N/A 10/19/2017   Procedure: LOOP RECORDER INSERTION;  Surgeon: Francyne Headland, MD;  Location: [facility];  Service: Cardiovascular;  Laterality: N/A;   PROSTATE BIOPSY     SPACE OAR INSTILLATION N/A 01/04/2023   Procedure: SPACE OAR INSTILLATION;  Surgeon: Renda Glance, MD;  Location: [facility];  Service: Urology;  Laterality: N/A;   TONSILLECTOMY  08/07/1966    Current Medications: Current Meds  Medication Sig   Multiple Vitamins-Minerals (CENTRUM SILVER ULTRA MENS PO) Take 1 tablet by mouth every Monday, Wednesday, and Friday.   rosuvastatin (CRESTOR) 5 MG tablet Take 5 mg by mouth daily.    sildenafil (VIAGRA) 100 MG tablet TAKE ONE TABLET BY MOUTH DAILY AS NEEDED for 30     Allergies:   Patient has no known allergies.   Social History   Socioeconomic History   Marital status: Married    Spouse name: Not on file   Number of children: Not on file   Years of education: Not on file   Highest education level: Not on file  Occupational History   Not on file  Tobacco Use   Smoking status: Former    Current  packs/day: 0.00    Types: Cigarettes    Quit date: 07/18/1967    Years since quitting: 56.6   Smokeless tobacco: Never  Vaping Use   Vaping status: Never Used  Substance and Sexual Activity   Alcohol  use: No   Drug use: No   Sexual activity: Yes  Other Topics Concern   Not on file  Social History Narrative   Not on file   Social Drivers of Health   Financial Resource Strain: Not on file  Food Insecurity: Not on file  Transportation Needs: Not on file  Physical Activity: Not on file  Stress: Not on file  Social Connections: Not on file     Family History: The patient's family history includes AAA (abdominal aortic aneurysm) in  his brother; Healthy in his daughter and son; Lung cancer (age of onset: 14) in his father; Lymphoma (age of onset: 10) in his mother. There is no history of Colon cancer, Stomach cancer, Esophageal cancer, Rectal cancer, or Colon polyps.  ROS:   Please see the history of present illness.   All other systems are reviewed and are negative.  EKGs/Labs/Other Studies Reviewed:    The following studies were reviewed today: most recent loop recorder download from earlier this month  EKG:  EKG is ordered today.  It shows mild sinus tachycardia otherwise completely normal  Recent Labs: From Dr. Orlando office 10/24/2023 Hemoglobin A1c 4.9%, potassium 4.4, creatinine 0.7, ALT 29, TSH 2.550  Recent Lipid Panel From Dr. Orlando office 10/24/2023 Cholesterol 184, HDL 113, LDL 62, triglycerides 45  Physical Exam:    VS:  BP 124/62 (BP Location: Left Arm, Patient Position: Sitting)   Pulse 87   Ht 5' 7 (1.702 m)   Wt 133 lb 3.2 oz (60.4 kg)   SpO2 99%   BMI 20.86 kg/m     Wt Readings from Last 3 Encounters:  02/18/24 133 lb 3.2 oz (60.4 kg)  01/04/23 134 lb (60.8 kg)  12/15/22 134 lb 2 oz (60.8 kg)    General: Alert, oriented x3, no distress, appears very lean and fit Head: no evidence of trauma, PERRL, EOMI, no exophtalmos or lid lag, no  myxedema, no xanthelasma; normal ears, nose and oropharynx Neck: normal jugular venous pulsations and no hepatojugular reflux; brisk carotid pulses without delay and no carotid bruits Chest: clear to auscultation, no signs of consolidation by percussion or palpation, normal fremitus, symmetrical and full respiratory excursions Cardiovascular: normal position and quality of the apical impulse, regular rhythm, normal first and second heart sounds, has a distinct midsystolic click with a late systolic murmur that increases with a Valsalva maneuver and diminishes with handgrip, no diastolic murmurs, rubs or gallops Abdomen: no tenderness or distention, no masses by palpation, no abnormal pulsatility or arterial bruits, normal bowel sounds, no hepatosplenomegaly Extremities: no clubbing, cyanosis or edema; 2+ radial, ulnar and brachial pulses bilaterally; 2+ right femoral, posterior tibial and dorsalis pedis pulses; 2+ left femoral, posterior tibial and dorsalis pedis pulses; no subclavian or femoral bruits Neurological: grossly nonfocal Psych: Normal mood and affect     ASSESSMENT:    1. History of syncope   2. Mitral valve prolapse   3. Hypercholesterolemia      PLAN:    In order of problems listed above:  Syncope: Recent near syncopal event sounds very much like hypotension (standing up while grilling out on a very warm day).  He has had 2 unusual syncopal events roughly 20 years apart.  No syncope during implantable loop recorder monitoring in 2019-2023, the device has since reached end of service.   ILR: Device is still in place, but no longer functional. MR: Physical exam strongly suggestive of mitral valve prolapse with end-systolic mitral regurgitation, but he is completely asymptomatic.  We discussed possible complications including endocarditis and chordal rupture with sudden worsening of dyspnea.  The echo from [facility] in [place] showed mild mitral regurgitation without  mention of prolapse or myxomatous changes.  The left ventricle was normal in size and function.  Further evaluation does not appear necessary since he is asymptomatic.  He would like to repeat an echocardiogram before he moves from [place] in the next 2 or 3 years. Hypercholesterolemia : Excellent lipid profile, continue statin.  Discussed the fact that  a coronary CT calcium score would not change his current management.  Medication Adjustments/Labs and Tests Ordered: Current medicines are reviewed at length with the patient today.  Concerns regarding medicines are outlined above.  Orders Placed This Encounter  Procedures   EKG 12-Lead   No orders of the defined types were placed in this encounter.   Patient Instructions  Medication Instructions:  The current medical regimen is effective;  continue present plan and medications.  *If you need a refill on your cardiac medications before your next appointment, please call your pharmacy*  Lab Work: NONE If you have labs (blood work) drawn today and your tests are completely normal, you will receive your results only by: MyChart Message (if you have MyChart) OR A paper copy in the mail If you have any lab test that is abnormal or we need to change your treatment, we will call you to review the results.  Testing/Procedures: NONE   Follow-Up: At [facility], you and your health needs are our priority.  As part of our continuing mission to provide you with exceptional heart care, our providers are all part of one team.  This team includes your primary Cardiologist (physician) and Advanced Practice Providers or APPs (Physician Assistants and Nurse Practitioners) who all work together to provide you with the care you need, when you need it.  Your next appointment:   1 Year  Provider:   Jerel Balding, MD    We recommend signing up for the patient portal called MyChart.  Sign up information is provided on this After Visit  Summary.  MyChart is used to connect with patients for Virtual Visits (Telemedicine).  Patients are able to view lab/test results, encounter notes, upcoming appointments, etc.  Non-urgent messages can be sent to your provider as well.   To learn more about what you can do with MyChart, go to [email or web address].      Signed, Jerel Balding, MD  02/18/2024 8:41 AM    [facility]

## 2024-02-18 NOTE — Patient Instructions (Signed)
 Medication Instructions:  The current medical regimen is effective;  continue present plan and medications.  *If you need a refill on your cardiac medications before your next appointment, please call your pharmacy*  Lab Work: NONE If you have labs (blood work) drawn today and your tests are completely normal, you will receive your results only by: MyChart Message (if you have MyChart) OR A paper copy in the mail If you have any lab test that is abnormal or we need to change your treatment, we will call you to review the results.  Testing/Procedures: NONE   Follow-Up: At [facility], you and your health needs are our priority.  As part of our continuing mission to provide you with exceptional heart care, our providers are all part of one team.  This team includes your primary Cardiologist (physician) and Advanced Practice Providers or APPs (Physician Assistants and Nurse Practitioners) who all work together to provide you with the care you need, when you need it.  Your next appointment:   1 Year  Provider:   Jerel Balding, MD    We recommend signing up for the patient portal called MyChart.  Sign up information is provided on this After Visit Summary.  MyChart is used to connect with patients for Virtual Visits (Telemedicine).  Patients are able to view lab/test results, encounter notes, upcoming appointments, etc.  Non-urgent messages can be sent to your provider as well.   To learn more about what you can do with MyChart, go to [email or web address].

## 2024-02-20 DIAGNOSIS — N5201 Erectile dysfunction due to arterial insufficiency: Secondary | ICD-10-CM | POA: Diagnosis not present

## 2024-02-20 DIAGNOSIS — C61 Malignant neoplasm of prostate: Secondary | ICD-10-CM | POA: Diagnosis not present

## 2024-03-14 DIAGNOSIS — H02831 Dermatochalasis of right upper eyelid: Secondary | ICD-10-CM | POA: Diagnosis not present

## 2024-03-14 DIAGNOSIS — H2513 Age-related nuclear cataract, bilateral: Secondary | ICD-10-CM | POA: Diagnosis not present

## 2024-03-14 DIAGNOSIS — H43813 Vitreous degeneration, bilateral: Secondary | ICD-10-CM | POA: Diagnosis not present

## 2024-03-14 DIAGNOSIS — H02834 Dermatochalasis of left upper eyelid: Secondary | ICD-10-CM | POA: Diagnosis not present

## 2024-03-14 DIAGNOSIS — H04123 Dry eye syndrome of bilateral lacrimal glands: Secondary | ICD-10-CM | POA: Diagnosis not present

## 2024-03-18 DIAGNOSIS — D2262 Melanocytic nevi of left upper limb, including shoulder: Secondary | ICD-10-CM | POA: Diagnosis not present

## 2024-03-18 DIAGNOSIS — L2089 Other atopic dermatitis: Secondary | ICD-10-CM | POA: Diagnosis not present

## 2024-03-18 DIAGNOSIS — D2272 Melanocytic nevi of left lower limb, including hip: Secondary | ICD-10-CM | POA: Diagnosis not present

## 2024-03-18 DIAGNOSIS — L570 Actinic keratosis: Secondary | ICD-10-CM | POA: Diagnosis not present

## 2024-03-18 DIAGNOSIS — L814 Other melanin hyperpigmentation: Secondary | ICD-10-CM | POA: Diagnosis not present

## 2024-03-18 DIAGNOSIS — L821 Other seborrheic keratosis: Secondary | ICD-10-CM | POA: Diagnosis not present

## 2024-03-18 DIAGNOSIS — D225 Melanocytic nevi of trunk: Secondary | ICD-10-CM | POA: Diagnosis not present

## 2024-03-18 DIAGNOSIS — Z85828 Personal history of other malignant neoplasm of skin: Secondary | ICD-10-CM | POA: Diagnosis not present

## 2024-03-18 DIAGNOSIS — D1801 Hemangioma of skin and subcutaneous tissue: Secondary | ICD-10-CM | POA: Diagnosis not present

## 2024-03-18 DIAGNOSIS — L578 Other skin changes due to chronic exposure to nonionizing radiation: Secondary | ICD-10-CM | POA: Diagnosis not present

## 2024-04-15 DIAGNOSIS — H2511 Age-related nuclear cataract, right eye: Secondary | ICD-10-CM | POA: Diagnosis not present

## 2024-04-22 DIAGNOSIS — H2512 Age-related nuclear cataract, left eye: Secondary | ICD-10-CM | POA: Diagnosis not present

## 2024-04-22 DIAGNOSIS — Z961 Presence of intraocular lens: Secondary | ICD-10-CM | POA: Diagnosis not present

## 2024-04-29 DIAGNOSIS — H2512 Age-related nuclear cataract, left eye: Secondary | ICD-10-CM | POA: Diagnosis not present
